# Patient Record
Sex: Female | Born: 1985 | Race: Black or African American | Hispanic: No | Marital: Married | Smoking: Never smoker
Health system: Southern US, Community
[De-identification: ages and names within clinical notes are randomized; demographics above are authoritative.]

## PROBLEM LIST (undated history)

## (undated) ENCOUNTER — Inpatient Hospital Stay (HOSPITAL_COMMUNITY): Payer: Self-pay

## (undated) DIAGNOSIS — O24419 Gestational diabetes mellitus in pregnancy, unspecified control: Secondary | ICD-10-CM

## (undated) DIAGNOSIS — D563 Thalassemia minor: Secondary | ICD-10-CM

## (undated) HISTORY — DX: Thalassemia minor: D56.3

## (undated) HISTORY — DX: Gestational diabetes mellitus in pregnancy, unspecified control: O24.419

---

## 2016-12-24 ENCOUNTER — Encounter (HOSPITAL_COMMUNITY): Payer: Self-pay | Admitting: Family Medicine

## 2016-12-24 ENCOUNTER — Ambulatory Visit (HOSPITAL_COMMUNITY)
Admission: EM | Admit: 2016-12-24 | Discharge: 2016-12-24 | Disposition: A | Discharge status: Home / Self Care | Payer: BLUE CROSS/BLUE SHIELD | Attending: Emergency Medicine | Admitting: Emergency Medicine

## 2016-12-24 DIAGNOSIS — Z3202 Encounter for pregnancy test, result negative: Secondary | ICD-10-CM | POA: Diagnosis not present

## 2016-12-24 DIAGNOSIS — A084 Viral intestinal infection, unspecified: Secondary | ICD-10-CM | POA: Diagnosis not present

## 2016-12-24 DIAGNOSIS — R112 Nausea with vomiting, unspecified: Secondary | ICD-10-CM

## 2016-12-24 DIAGNOSIS — R109 Unspecified abdominal pain: Secondary | ICD-10-CM

## 2016-12-24 LAB — POCT URINALYSIS DIP (DEVICE)
BILIRUBIN URINE: NEGATIVE
Glucose, UA: NEGATIVE mg/dL
HGB URINE DIPSTICK: NEGATIVE
KETONES UR: NEGATIVE mg/dL
LEUKOCYTES UA: NEGATIVE
Nitrite: NEGATIVE
Protein, ur: NEGATIVE mg/dL
Urobilinogen, UA: 0.2 mg/dL (ref 0.0–1.0)
pH: 5.5 (ref 5.0–8.0)

## 2016-12-24 LAB — POCT PREGNANCY, URINE: Preg Test, Ur: NEGATIVE

## 2016-12-24 MED ORDER — LOPERAMIDE HCL 2 MG PO CAPS: 2.0000 mg | ORAL_CAPSULE | Freq: Four times a day (QID) | ORAL | 0 refills | Status: DC | PRN

## 2016-12-24 MED ORDER — ONDANSETRON 4 MG PO TBDP: 4.0000 mg | ORAL_TABLET | Freq: Three times a day (TID) | ORAL | 0 refills | Status: DC | PRN

## 2016-12-24 MED ORDER — OMEPRAZOLE 40 MG PO CPDR: 40.0000 mg | DELAYED_RELEASE_CAPSULE | Freq: Two times a day (BID) | ORAL | 0 refills | Status: DC

## 2016-12-24 MED ORDER — DICYCLOMINE HCL 20 MG PO TABS: 20.0000 mg | ORAL_TABLET | Freq: Two times a day (BID) | ORAL | 0 refills | Status: DC

## 2016-12-24 NOTE — Discharge Instructions (Signed)
You most likely have viral gastritis. I prescribed medications to help your symptoms. Forr Nausea, I have prescribed Zofran, take 1 tablet under the tongue every 8 hours as needed. For abdominal pain and cramping I prescribed a medicine called Bentyl, take one tablet by mouth twice daily. And finally have prescribed a medicine called omeprazole, take 1 tablet by mouth twice daily. Should your symptoms persist, or fail to improve, follow up with your primary care provider or return to clinic as needed. If your symptoms worsen, particularly if you develop worsened abdominal pain, fever, signs or symptoms of severe dehydration, then go to the emergency room.   I have attached the contact information for community health and wellness, contact them to schedule an appointment to establish for primary care.

## 2016-12-24 NOTE — ED Triage Notes (Signed)
Pt here for al over body aches more in the back and chest. sts also diarrhea and vomiting that all started last night. denies cough or fever. Denies recent travel or around anybody that is sick.

## 2016-12-24 NOTE — ED Provider Notes (Signed)
CSN: [id number]     Arrival date & time 12/24/16  1358 History   None    Chief Complaint  Patient presents with  . Generalized Body Aches  . Vomiting   (Consider location/radiation/quality/duration/timing/severity/associated sxs/prior Treatment) Ariana [name] is a 31 y.o. female who [facility] with a chief complaint of N/V/D/abd pain for 24 hours    The history is provided by the patient and the spouse.  Abdominal Pain  Pain location:  Generalized Pain quality: aching, bloating and cramping   Pain radiates to:  Back Pain severity:  Moderate Onset quality:  Gradual Duration:  24 hours Timing:  Constant Progression:  Unchanged Chronicity:  New Context: not diet changes, not eating, not recent illness, not sick contacts, not suspicious food intake and not trauma   Relieved by:  None tried Worsened by:  Movement Ineffective treatments:  None tried Associated symptoms: diarrhea, nausea and vomiting   Associated symptoms: no anorexia, no chills, no dysuria, no fever and no sore throat   Risk factors: not pregnant     History reviewed. No pertinent past medical history. History reviewed. No pertinent surgical history. History reviewed. No pertinent family history. Social History  Substance Use Topics  . Smoking status: Not on file  . Smokeless tobacco: Not on file  . Alcohol use Not on file   OB History    No data available     Review of Systems  Constitutional: Negative for chills and fever.  HENT: Negative for congestion, sinus pain, sinus pressure and sore throat.   Respiratory: Negative.   Cardiovascular: Negative.   Gastrointestinal: Positive for abdominal pain, diarrhea, nausea and vomiting. Negative for anorexia.  Genitourinary: Negative for dysuria.  Musculoskeletal: Negative.   Skin: Negative.   Neurological: Negative.     Allergies  Patient has no known allergies.  Home Medications   Prior to Admission medications    Medication Sig Start Date End Date Taking? Authorizing Provider  dicyclomine (BENTYL) 20 MG tablet Take 1 tablet (20 mg total) by mouth 2 (two) times daily. 12/24/16   Dorena BodoKennard, Mikka Kissner, NP  loperamide (IMODIUM) 2 MG capsule Take 1 capsule (2 mg total) by mouth 4 (four) times daily as needed for diarrhea or loose stools. 12/24/16   Dorena BodoKennard, Huckleberry Martinson, NP  omeprazole (PRILOSEC) 40 MG capsule Take 1 capsule (40 mg total) by mouth 2 (two) times daily. 12/24/16 01/07/17  Dorena BodoKennard, Jaylene Schrom, NP  ondansetron (ZOFRAN ODT) 4 MG disintegrating tablet Take 1 tablet (4 mg total) by mouth every 8 (eight) hours as needed for nausea or vomiting. 12/24/16   Dorena BodoKennard, Yosgar Demirjian, NP   Meds Ordered and Administered this Visit  Medications - No data to display  BP 104/75   Pulse 91   Temp 98.4 F (36.9 C)   Resp 18   LMP 12/14/2016   SpO2 100%  No data found.   Physical Exam  Constitutional: She is oriented to person, place, and time. She appears well-developed and well-nourished. No distress.  HENT:  Head: Normocephalic.  Right Ear: External ear normal.  Left Ear: External ear normal.  Eyes: Conjunctivae are normal.  Cardiovascular: Normal rate and regular rhythm.   Pulmonary/Chest: Effort normal and breath sounds normal.  Abdominal: Soft. Bowel sounds are increased. There is generalized tenderness. There is no rebound, no tenderness at McBurney's point and negative Murphy's sign.  Musculoskeletal:       Lumbar back: She exhibits tenderness.  Neurological: She is alert and oriented to  person, place, and time.  Skin: Skin is warm and dry. Capillary refill takes less than 2 seconds. She is not diaphoretic.  Nursing note and vitals reviewed.   Urgent Care Course     Procedures (including critical care time)  Labs Review Labs Reviewed  POCT URINALYSIS DIP (DEVICE)  POCT PREGNANCY, URINE    Imaging Review No results found.     MDM   1. Viral gastroenteritis    UA and pregnancy test  negative, most likely viral gastroenteritis, given prescriptions for multiple medications, counseling regarding the diagnosis, recommend clear liquid diet for next 24 hours, follow-up as needed, go to the ER anytime if worse. Given contact information for community health and wellness, contact them to establish for primary care.     Dorena Bodo, NP 12/24/16 1456

## 2017-03-01 ENCOUNTER — Encounter (HOSPITAL_COMMUNITY): Payer: Self-pay | Admitting: *Deleted

## 2017-03-01 ENCOUNTER — Ambulatory Visit (HOSPITAL_COMMUNITY)
Admission: EM | Admit: 2017-03-01 | Discharge: 2017-03-01 | Disposition: A | Discharge status: Home / Self Care | Payer: BLUE CROSS/BLUE SHIELD | Attending: Emergency Medicine | Admitting: Emergency Medicine

## 2017-03-01 DIAGNOSIS — R112 Nausea with vomiting, unspecified: Secondary | ICD-10-CM

## 2017-03-01 DIAGNOSIS — E860 Dehydration: Secondary | ICD-10-CM

## 2017-03-01 DIAGNOSIS — M545 Low back pain: Secondary | ICD-10-CM

## 2017-03-01 DIAGNOSIS — O0000 Abdominal pregnancy without intrauterine pregnancy: Secondary | ICD-10-CM | POA: Diagnosis not present

## 2017-03-01 DIAGNOSIS — R1084 Generalized abdominal pain: Secondary | ICD-10-CM

## 2017-03-01 DIAGNOSIS — Z3201 Encounter for pregnancy test, result positive: Secondary | ICD-10-CM

## 2017-03-01 LAB — POCT URINALYSIS DIP (DEVICE)
GLUCOSE, UA: NEGATIVE mg/dL
HGB URINE DIPSTICK: NEGATIVE
KETONES UR: 40 mg/dL — AB
LEUKOCYTES UA: NEGATIVE
Nitrite: NEGATIVE
Protein, ur: 30 mg/dL — AB
Urobilinogen, UA: 1 mg/dL (ref 0.0–1.0)
pH: 6 (ref 5.0–8.0)

## 2017-03-01 LAB — POCT PREGNANCY, URINE: Preg Test, Ur: POSITIVE — AB

## 2017-03-01 MED ORDER — ONDANSETRON HCL 4 MG PO TABS: 4.0000 mg | ORAL_TABLET | Freq: Four times a day (QID) | ORAL | 0 refills | Status: DC

## 2017-03-01 NOTE — ED Provider Notes (Addendum)
[facility]    CSN: [id number] Arrival date & time: 03/01/17  1628     History   Chief Complaint Chief Complaint  Patient presents with  . Emesis    HPI Ariana [name] is a 31 y.o. female.    Pt c/o generalized abd pain n/v for over 1 week. States that she does have some lower back pain. Would also like to know if she is pregnant. States that her lMP was last month and normal.  Has not taken anything pta.        History reviewed. No pertinent past medical history.  There are no active problems to display for this patient.   History reviewed. No pertinent surgical history.  OB History    No data available       Home Medications    Prior to Admission medications   Medication Sig Start Date End Date Taking? Authorizing Provider  dicyclomine (BENTYL) 20 MG tablet Take 1 tablet (20 mg total) by mouth 2 (two) times daily. 12/24/16   Dorena Bodo, NP  loperamide (IMODIUM) 2 MG capsule Take 1 capsule (2 mg total) by mouth 4 (four) times daily as needed for diarrhea or loose stools. 12/24/16   Dorena Bodo, NP  omeprazole (PRILOSEC) 40 MG capsule Take 1 capsule (40 mg total) by mouth 2 (two) times daily. 12/24/16 01/07/17  Dorena Bodo, NP  ondansetron (ZOFRAN ODT) 4 MG disintegrating tablet Take 1 tablet (4 mg total) by mouth every 8 (eight) hours as needed for nausea or vomiting. 12/24/16   Dorena Bodo, NP  ondansetron (ZOFRAN) 4 MG tablet Take 1 tablet (4 mg total) by mouth every 6 (six) hours. 03/01/17   Coralyn Mark, NP    Family History History reviewed. No pertinent family history.  Social History Social History  Substance Use Topics  . Smoking status: Never Smoker  . Smokeless tobacco: Never Used  . Alcohol use Not on file     Allergies   Patient has no known allergies.   Review of Systems Review of Systems  Constitutional: Positive for chills, fatigue and fever.  Respiratory: Negative.   Cardiovascular: Negative.     Gastrointestinal: Positive for abdominal pain, nausea and vomiting.  Endocrine: Negative.   Genitourinary: Positive for pelvic pain and urgency.  Neurological: Negative.      Physical Exam Triage Vital Signs ED Triage Vitals [03/01/17 1727]  Enc Vitals Group     BP 120/82     Pulse Rate 85     Resp 16     Temp 98.7 F (37.1 C)     Temp Source Oral     SpO2 100 %     Weight      Height      Head Circumference      Peak Flow      Pain Score 4     Pain Loc      Pain Edu?      Excl. in GC?    No data found.   Updated Vital Signs BP 120/82 (BP Location: Left Arm)   Pulse 85   Temp 98.7 F (37.1 C) (Oral)   Resp 16   LMP 01/17/2017   SpO2 100%   Visual Acuity Right Eye Distance:   Left Eye Distance:   Bilateral Distance:    Right Eye Near:   Left Eye Near:    Bilateral Near:     Physical Exam  Constitutional: She appears well-developed.  Cardiovascular: Normal rate and regular  rhythm.   Pulmonary/Chest: Effort normal and breath sounds normal.  Abdominal: Soft. Bowel sounds are normal.  cv tenderness   Neurological: She is alert.  Skin: Skin is warm. Capillary refill takes less than 2 seconds.     UC Treatments / Results  Labs (all labs ordered are listed, but only abnormal results are displayed) Labs Reviewed  POCT URINALYSIS DIP (DEVICE) - Abnormal; Notable for the following:       Result Value   Bilirubin Urine SMALL (*)    Ketones, ur 40 (*)    Protein, ur 30 (*)    All other components within normal limits  POCT PREGNANCY, URINE - Abnormal; Notable for the following:    Preg Test, Ur POSITIVE (*)    All other components within normal limits    EKG  EKG Interpretation None       Radiology No results found.  Procedures Procedures (including critical care time)  Medications Ordered in UC Medications - No data to display   Initial Impression / Assessment and Plan / UC Course  I have reviewed the triage vital signs and the nursing  notes.  Pertinent labs & imaging results that were available during my care of the patient were reviewed by me and considered in my medical decision making (see chart for details).   Will need to see obgyn for positive preg  Take nausea medications as needed         Final Clinical Impressions(s) / UC Diagnoses   Final diagnoses:  Nausea and vomiting, intractability of vomiting not specified, unspecified vomiting type  Dehydration  Generalized abdominal pain  Abdominal pregnancy, unspecified whether intrauterine pregnancy present    New Prescriptions Discharge Medication List as of 03/01/2017  6:09 PM    START taking these medications   Details  ondansetron (ZOFRAN) 4 MG tablet Take 1 tablet (4 mg total) by mouth every 6 (six) hours., Starting Mon 03/01/2017, Normal         Controlled Substance Prescriptions [place] Controlled Substance Registry consulted? Not Applicable  Coralyn Mark, NP 03/01/17 1810  Coralyn Mark, NP 03/01/17 1821

## 2017-03-01 NOTE — ED Triage Notes (Addendum)
Patient reports vomiting since end of last week with generalized fatigue. Patient reports chest burning, is worse after eating.   Would also like to know if she is pregnant.

## 2017-03-01 NOTE — Discharge Instructions (Signed)
You will need to see an obgyn one you have seen in the past.  Drink plenty of fluids Avoid caffeine Take nausea medication as needed You have a positive preg test

## 2017-03-12 ENCOUNTER — Inpatient Hospital Stay (HOSPITAL_COMMUNITY)
Admission: AD | Admit: 2017-03-12 | Discharge: 2017-03-12 | Disposition: A | Discharge status: Home / Self Care | Payer: BLUE CROSS/BLUE SHIELD | Source: Ambulatory Visit | Attending: Obstetrics & Gynecology | Admitting: Obstetrics & Gynecology

## 2017-03-12 ENCOUNTER — Encounter (HOSPITAL_COMMUNITY): Payer: Self-pay | Admitting: *Deleted

## 2017-03-12 DIAGNOSIS — O219 Vomiting of pregnancy, unspecified: Secondary | ICD-10-CM

## 2017-03-12 DIAGNOSIS — K117 Disturbances of salivary secretion: Secondary | ICD-10-CM | POA: Diagnosis not present

## 2017-03-12 DIAGNOSIS — O218 Other vomiting complicating pregnancy: Secondary | ICD-10-CM | POA: Diagnosis present

## 2017-03-12 DIAGNOSIS — Z3A08 8 weeks gestation of pregnancy: Secondary | ICD-10-CM | POA: Insufficient documentation

## 2017-03-12 DIAGNOSIS — O99611 Diseases of the digestive system complicating pregnancy, first trimester: Secondary | ICD-10-CM | POA: Insufficient documentation

## 2017-03-12 DIAGNOSIS — R112 Nausea with vomiting, unspecified: Secondary | ICD-10-CM | POA: Insufficient documentation

## 2017-03-12 LAB — URINALYSIS, ROUTINE W REFLEX MICROSCOPIC
Bacteria, UA: NONE SEEN
Glucose, UA: NEGATIVE mg/dL
HGB URINE DIPSTICK: NEGATIVE
KETONES UR: 20 mg/dL — AB
LEUKOCYTES UA: NEGATIVE
Nitrite: NEGATIVE
PH: 5 (ref 5.0–8.0)
Protein, ur: 100 mg/dL — AB
SPECIFIC GRAVITY, URINE: 1.031 — AB (ref 1.005–1.030)

## 2017-03-12 LAB — BASIC METABOLIC PANEL
Anion gap: 17 — ABNORMAL HIGH (ref 5–15)
BUN: 16 mg/dL (ref 6–20)
CHLORIDE: 92 mmol/L — AB (ref 101–111)
CO2: 22 mmol/L (ref 22–32)
CREATININE: 1 mg/dL (ref 0.44–1.00)
Calcium: 9.6 mg/dL (ref 8.9–10.3)
GFR calc Af Amer: >60 mL/min (ref >60)
GFR calc non Af Amer: >60 mL/min (ref >60)
GLUCOSE: 93 mg/dL (ref 65–99)
POTASSIUM: 4.6 mmol/L (ref 3.5–5.1)
Sodium: 131 mmol/L — ABNORMAL LOW (ref 135–145)

## 2017-03-12 LAB — CBC
HEMATOCRIT: 39.7 % (ref 36.0–46.0)
HEMOGLOBIN: 13.8 g/dL (ref 12.0–15.0)
MCH: 29.2 pg (ref 26.0–34.0)
MCHC: 34.8 g/dL (ref 30.0–36.0)
MCV: 83.9 fL (ref 78.0–100.0)
Platelets: 244 10*3/uL (ref 150–400)
RBC: 4.73 MIL/uL (ref 3.87–5.11)
RDW: 12.2 % (ref 11.5–15.5)
WBC: 7.6 10*3/uL (ref 4.0–10.5)

## 2017-03-12 MED ORDER — M.V.I. ADULT IV INJ
Freq: Once | INTRAVENOUS | Status: AC
  Administered 2017-03-12: 12:00:00 via INTRAVENOUS
  Filled 2017-03-12: qty 10

## 2017-03-12 MED ORDER — PROMETHAZINE HCL 25 MG PO TABS: 25.0000 mg | ORAL_TABLET | Freq: Four times a day (QID) | ORAL | 0 refills | Status: DC | PRN

## 2017-03-12 MED ORDER — LACTATED RINGERS IV BOLUS (SEPSIS)
1000.0000 mL | Freq: Once | INTRAVENOUS | Status: AC
  Administered 2017-03-12: 1000 mL via INTRAVENOUS

## 2017-03-12 MED ORDER — GLYCOPYRROLATE 1 MG PO TABS: 1.0000 mg | ORAL_TABLET | Freq: Three times a day (TID) | ORAL | 0 refills | Status: DC

## 2017-03-12 MED ORDER — GLYCOPYRROLATE 0.2 MG/ML IJ SOLN
0.2000 mg | Freq: Once | INTRAMUSCULAR | Status: AC
  Administered 2017-03-12: 0.2 mg via INTRAVENOUS
  Filled 2017-03-12: qty 1

## 2017-03-12 MED ORDER — PROMETHAZINE HCL 25 MG/ML IJ SOLN
25.0000 mg | Freq: Once | INTRAMUSCULAR | Status: AC
  Administered 2017-03-12: 25 mg via INTRAVENOUS
  Filled 2017-03-12: qty 1

## 2017-03-12 MED ORDER — LACTATED RINGERS IV BOLUS (SEPSIS): 1000.0000 mL | Freq: Once | INTRAVENOUS | Status: DC

## 2017-03-12 MED ORDER — PANTOPRAZOLE SODIUM 40 MG IV SOLR
40.0000 mg | Freq: Once | INTRAVENOUS | Status: AC
  Administered 2017-03-12: 40 mg via INTRAVENOUS
  Filled 2017-03-12: qty 40

## 2017-03-12 NOTE — MAU Note (Signed)
Pt presents with c/o being "weak".  States she's been vomiting for approximately 2 weeks.  Also c/o sore throat and neck pain.

## 2017-03-12 NOTE — Discharge Instructions (Signed)
Morning Sickness Morning sickness is when you feel sick to your stomach (nauseous) during pregnancy. This nauseous feeling may or may not come with vomiting. It often occurs in the morning but can be a problem any time of day. Morning sickness is most common during the first trimester, but it may continue throughout pregnancy. While morning sickness is unpleasant, it is usually harmless unless you develop severe and continual vomiting (hyperemesis gravidarum). This condition requires more intense treatment. What are the causes? The cause of morning sickness is not completely known but seems to be related to normal hormonal changes that occur in pregnancy. What increases the risk? You are at greater risk if you:  Experienced nausea or vomiting before your pregnancy.  Had morning sickness during a previous pregnancy.  Are pregnant with more than one baby, such as twins.  How is this treated? Do not use any medicines (prescription, over-the-counter, or herbal) for morning sickness without first talking to your health care provider. Your health care provider may prescribe or recommend:  Vitamin B6 supplements.  Anti-nausea medicines.  The herbal medicine ginger.  Follow these instructions at home:  Only take over-the-counter or prescription medicines as directed by your health care provider.  Taking multivitamins before getting pregnant can prevent or decrease the severity of morning sickness in most women.  Eat a piece of dry toast or unsalted crackers before getting out of bed in the morning.  Eat five or six small meals a day.  Eat dry and bland foods (rice, baked potato). Foods high in carbohydrates are often helpful.  Do not drink liquids with your meals. Drink liquids between meals.  Avoid greasy, fatty, and spicy foods.  Get someone to cook for you if the smell of any food causes nausea and vomiting.  If you feel nauseous after taking prenatal vitamins, take the vitamins at  night or with a snack.  Snack on protein foods (nuts, yogurt, cheese) between meals if you are hungry.  Eat unsweetened gelatins for desserts.  Wearing an acupressure wristband (worn for sea sickness) may be helpful.  Acupuncture may be helpful.  Do not smoke.  Get a humidifier to keep the air in your house free of odors.  Get plenty of fresh air. Contact a health care provider if:  Your home remedies are not working, and you need medicine.  You feel dizzy or lightheaded.  You are losing weight. Get help right away if:  You have persistent and uncontrolled nausea and vomiting.  You pass out (faint). This information is not intended to replace advice given to you by your health care provider. Make sure you discuss any questions you have with your health care provider. Document Released: 07/23/2006 Document Revised: 11/07/2015 Document Reviewed: 11/16/2012 Elsevier Interactive Patient Education  2017 [employer]  Constipation, Adult Constipation is when a person:  Poops (has a bowel movement) fewer times in a week than normal.  Has a hard time pooping.  Has poop that is dry, hard, or bigger than normal.  Follow these instructions at home: Eating and drinking   Eat foods that have a lot of fiber, such as: ? Fresh fruits and vegetables. ? Whole grains. ? Beans.  Eat less of foods that are high in fat, low in fiber, or overly processed, such as: ? [place] fries. ? Hamburgers. ? Cookies. ? Candy. ? Soda.  Drink enough fluid to keep your pee (urine) clear or pale yellow. General instructions  Exercise regularly or as told by your doctor.  Go to the restroom when you feel like you need to poop. Do not hold it in.  Take over-the-counter and prescription medicines only as told by your doctor. These include any fiber supplements.  Do pelvic floor retraining exercises, such as: ? Doing deep breathing while relaxing your lower belly (abdomen). ? Relaxing your  pelvic floor while pooping.  Watch your condition for any changes.  Keep all follow-up visits as told by your doctor. This is important. Contact a doctor if:  You have pain that gets worse.  You have a fever.  You have not pooped for 4 days.  You throw up (vomit).  You are not hungry.  You lose weight.  You are bleeding from the anus.  You have thin, pencil-like poop (stool). Get help right away if:  You have a fever, and your symptoms suddenly get worse.  You leak poop or have blood in your poop.  Your belly feels hard or bigger than normal (is bloated).  You have very bad belly pain.  You feel dizzy or you faint. This information is not intended to replace advice given to you by your health care provider. Make sure you discuss any questions you have with your health care provider. Document Released: 11/18/2007 Document Revised: 12/20/2015 Document Reviewed: 11/20/2015 Elsevier Interactive Patient Education  2017 [employer].

## 2017-03-12 NOTE — MAU Provider Note (Signed)
History     CSN: [id number]  Arrival date and time: 03/12/17 0941  First Provider Initiated Contact with Patient 03/12/17 1023      Chief Complaint  Patient presents with  . Emesis  . Fatigue  . Neck Pain   HPI Ariana [name] is a 31 y.o. G4P3003 at [redacted]w[redacted]d who presents for vomiting & weakness. Reports vomiting for the last 2 weeks. Was previously prescribed zofran by urgent care but has finished her prescription. Reports vomiting 6+ times today and is also having increased spitting. Endorses throat pain & neck pain. Denies abdominal pain, fever/chills, or vaginal bleeding. Last BM was 2 weeks ago. States she normally has a BM once per week.   OB History    Gravida Para Term Preterm AB Living   SAB TAB Ectopic Multiple Live Births           3      History reviewed. No pertinent past medical history.  History reviewed. No pertinent surgical history.  No family history on file.  Social History  Substance Use Topics  . Smoking status: Never Smoker  . Smokeless tobacco: Never Used  . Alcohol use Not on file    Allergies: No Known Allergies  Prescriptions Prior to Admission  Medication Sig Dispense Refill Last Dose  . dicyclomine (BENTYL) 20 MG tablet Take 1 tablet (20 mg total) by mouth 2 (two) times daily. 20 tablet 0   . loperamide (IMODIUM) 2 MG capsule Take 1 capsule (2 mg total) by mouth 4 (four) times daily as needed for diarrhea or loose stools. 12 capsule 0   . omeprazole (PRILOSEC) 40 MG capsule Take 1 capsule (40 mg total) by mouth 2 (two) times daily. 28 capsule 0   . ondansetron (ZOFRAN ODT) 4 MG disintegrating tablet Take 1 tablet (4 mg total) by mouth every 8 (eight) hours as needed for nausea or vomiting. 20 tablet 0   . ondansetron (ZOFRAN) 4 MG tablet Take 1 tablet (4 mg total) by mouth every 6 (six) hours. 12 tablet 0     Review of Systems  HENT: Positive for sore throat.   Gastrointestinal: Positive for constipation, nausea and vomiting.  Negative for abdominal pain and diarrhea.  Genitourinary: Negative.   Neurological: Positive for weakness. Negative for dizziness and headaches.   Physical Exam   Blood pressure 116/80, pulse 99, temperature 98.2 F (36.8 C), temperature source Oral, resp. rate 18, weight 176 lb 8 oz (80.1 kg), last menstrual period 01/13/2017, SpO2 100 %.  Physical Exam  Nursing note and vitals reviewed. Constitutional: She is oriented to person, place, and time. She appears well-developed and well-nourished. No distress.  HENT:  Head: Normocephalic and atraumatic.  Mouth/Throat: Oropharynx is clear and moist.  Eyes: Conjunctivae are normal. Right eye exhibits no discharge. Left eye exhibits no discharge. No scleral icterus.  Neck: Normal range of motion.  Cardiovascular: Normal rate, regular rhythm and normal heart sounds.   No murmur heard. Respiratory: Effort normal and breath sounds normal. No respiratory distress. She has no wheezes.  GI: Soft. Bowel sounds are normal. She exhibits no distension. There is no tenderness.  Neurological: She is alert and oriented to person, place, and time.  Skin: Skin is warm and dry. She is not diaphoretic.  Psychiatric: She has a normal mood and affect. Her behavior is normal. Judgment and thought content normal.    MAU Course  Procedures Results for orders placed or  performed during the hospital encounter of 03/12/17 (from the past 24 hour(s))  Urinalysis, Routine w reflex microscopic     Status: Abnormal   Collection Time: 03/12/17  9:58 AM  Result Value Ref Range   Color, Urine AMBER (A) YELLOW   APPearance HAZY (A) CLEAR   Specific Gravity, Urine 1.031 (H) 1.005 - 1.030   pH 5.0 5.0 - 8.0   Glucose, UA NEGATIVE NEGATIVE mg/dL   Hgb urine dipstick NEGATIVE NEGATIVE   Bilirubin Urine SMALL (A) NEGATIVE   Ketones, ur 20 (A) NEGATIVE mg/dL   Protein, ur 161 (A) NEGATIVE mg/dL   Nitrite NEGATIVE NEGATIVE   Leukocytes, UA NEGATIVE NEGATIVE   RBC / HPF  0-5 0 - 5 RBC/hpf   WBC, UA 6-30 0 - 5 WBC/hpf   Bacteria, UA NONE SEEN NONE SEEN   Squamous Epithelial / LPF 0-5 (A) NONE SEEN   Mucus PRESENT    Hyaline Casts, UA PRESENT   CBC     Status: None   Collection Time: 03/12/17 10:50 AM  Result Value Ref Range   WBC 7.6 4.0 - 10.5 K/uL   RBC 4.73 3.87 - 5.11 MIL/uL   Hemoglobin 13.8 12.0 - 15.0 g/dL   HCT 09.6 04.5 - 40.9 %   MCV 83.9 78.0 - 100.0 fL   MCH 29.2 26.0 - 34.0 pg   MCHC 34.8 30.0 - 36.0 g/dL   RDW 81.1 91.4 - 78.2 %   Platelets 244 150 - 400 K/uL  Basic metabolic panel     Status: Abnormal   Collection Time: 03/12/17 10:50 AM  Result Value Ref Range   Sodium 131 (L) 135 - 145 mmol/L   Potassium 4.6 3.5 - 5.1 mmol/L   Chloride 92 (L) 101 - 111 mmol/L   CO2 22 22 - 32 mmol/L   Glucose, Bld 93 65 - 99 mg/dL   BUN 16 6 - 20 mg/dL   Creatinine, Ser 9.56 0.44 - 1.00 mg/dL   Calcium 9.6 8.9 - 21.3 mg/dL   GFR calc non Af Amer >60 >60 mL/min   GFR calc Af Amer >60 >60 mL/min   Anion gap 17 (H) 5 - 15    MDM IV LR bolus followed by MVI in D5LR Phenergan 25 mg, protonix 40 mg, & robinul 0.2 mg IV CBC & BMP Patient reports improvement in symptoms & not observed vomiting in MAU Assessment and Plan  A: 1. [redacted] weeks gestation of pregnancy   2. Nausea and vomiting during pregnancy prior to [redacted] weeks gestation   3. Ptyalism    P: Discharge home Rx phenergan & robinul Start prenatal care Discussed reasons to return to MAU  Judeth Horn 03/12/2017, 10:01 AM

## 2017-03-12 NOTE — MAU Note (Signed)
Feels weak. Can't eat, keeps vomiting (spitting noted). Pain in her neck and throat.  Was seen at [facility] on 9/17, +preg test, states was given a prescription - but has finished it.

## 2017-03-31 ENCOUNTER — Other Ambulatory Visit: Payer: Self-pay | Admitting: Student

## 2017-04-06 ENCOUNTER — Emergency Department (HOSPITAL_COMMUNITY)
Admission: EM | Admit: 2017-04-06 | Discharge: 2017-04-06 | Disposition: A | Discharge status: Home / Self Care | Payer: BLUE CROSS/BLUE SHIELD | Attending: Emergency Medicine | Admitting: Emergency Medicine

## 2017-04-06 ENCOUNTER — Encounter (HOSPITAL_COMMUNITY): Payer: Self-pay | Admitting: Emergency Medicine

## 2017-04-06 ENCOUNTER — Emergency Department (HOSPITAL_COMMUNITY): Payer: BLUE CROSS/BLUE SHIELD

## 2017-04-06 DIAGNOSIS — Z3A12 12 weeks gestation of pregnancy: Secondary | ICD-10-CM | POA: Insufficient documentation

## 2017-04-06 DIAGNOSIS — Z79899 Other long term (current) drug therapy: Secondary | ICD-10-CM | POA: Diagnosis not present

## 2017-04-06 DIAGNOSIS — R0789 Other chest pain: Secondary | ICD-10-CM | POA: Insufficient documentation

## 2017-04-06 DIAGNOSIS — O210 Mild hyperemesis gravidarum: Secondary | ICD-10-CM

## 2017-04-06 DIAGNOSIS — R1110 Vomiting, unspecified: Secondary | ICD-10-CM | POA: Diagnosis present

## 2017-04-06 LAB — COMPREHENSIVE METABOLIC PANEL
ALT: 39 U/L (ref 14–54)
ANION GAP: 8 (ref 5–15)
AST: 30 U/L (ref 15–41)
Albumin: 4.1 g/dL (ref 3.5–5.0)
Alkaline Phosphatase: 49 U/L (ref 38–126)
BILIRUBIN TOTAL: 1.3 mg/dL — AB (ref 0.3–1.2)
BUN: 6 mg/dL (ref 6–20)
CO2: 25 mmol/L (ref 22–32)
Calcium: 9.3 mg/dL (ref 8.9–10.3)
Chloride: 102 mmol/L (ref 101–111)
Creatinine, Ser: 0.64 mg/dL (ref 0.44–1.00)
Glucose, Bld: 94 mg/dL (ref 65–99)
POTASSIUM: 4.2 mmol/L (ref 3.5–5.1)
Sodium: 135 mmol/L (ref 135–145)
TOTAL PROTEIN: 7.2 g/dL (ref 6.5–8.1)

## 2017-04-06 LAB — URINALYSIS, ROUTINE W REFLEX MICROSCOPIC
BILIRUBIN URINE: NEGATIVE
GLUCOSE, UA: NEGATIVE mg/dL
HGB URINE DIPSTICK: NEGATIVE
KETONES UR: NEGATIVE mg/dL
Leukocytes, UA: NEGATIVE
NITRITE: NEGATIVE
PH: 7 (ref 5.0–8.0)
Protein, ur: NEGATIVE mg/dL
SPECIFIC GRAVITY, URINE: 1.006 (ref 1.005–1.030)

## 2017-04-06 LAB — CBC
HEMATOCRIT: 33.4 % — AB (ref 36.0–46.0)
HEMOGLOBIN: 11.1 g/dL — AB (ref 12.0–15.0)
MCH: 29.2 pg (ref 26.0–34.0)
MCHC: 33.2 g/dL (ref 30.0–36.0)
MCV: 87.9 fL (ref 78.0–100.0)
Platelets: 204 10*3/uL (ref 150–400)
RBC: 3.8 MIL/uL — ABNORMAL LOW (ref 3.87–5.11)
RDW: 13.2 % (ref 11.5–15.5)
WBC: 5.9 10*3/uL (ref 4.0–10.5)

## 2017-04-06 LAB — I-STAT CG4 LACTIC ACID, ED: Lactic Acid, Venous: 1.19 mmol/L (ref 0.5–1.9)

## 2017-04-06 MED ORDER — ONDANSETRON HCL 4 MG/2ML IJ SOLN
4.0000 mg | Freq: Once | INTRAMUSCULAR | Status: AC
  Administered 2017-04-06: 4 mg via INTRAVENOUS
  Filled 2017-04-06: qty 2

## 2017-04-06 MED ORDER — PRENATAL COMPLETE 14-0.4 MG PO TABS: 1.0000 | ORAL_TABLET | Freq: Every day | ORAL | 0 refills | Status: DC

## 2017-04-06 MED ORDER — LACTATED RINGERS IV BOLUS (SEPSIS)
1000.0000 mL | Freq: Once | INTRAVENOUS | Status: AC
  Administered 2017-04-06: 1000 mL via INTRAVENOUS

## 2017-04-06 MED ORDER — PANTOPRAZOLE SODIUM 40 MG IV SOLR
40.0000 mg | INTRAVENOUS | Status: AC
  Administered 2017-04-06: 40 mg via INTRAVENOUS
  Filled 2017-04-06: qty 40

## 2017-04-06 MED ORDER — DOCUSATE SODIUM 100 MG PO CAPS: 100.0000 mg | ORAL_CAPSULE | Freq: Every day | ORAL | 0 refills | Status: DC

## 2017-04-06 MED ORDER — ONDANSETRON 4 MG PO TBDP: 4.0000 mg | ORAL_TABLET | Freq: Three times a day (TID) | ORAL | 0 refills | Status: DC | PRN

## 2017-04-06 MED ORDER — FAMOTIDINE 20 MG PO TABS: 20.0000 mg | ORAL_TABLET | Freq: Two times a day (BID) | ORAL | 0 refills | Status: DC
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
## 2017-04-07 ENCOUNTER — Encounter: Payer: Self-pay | Admitting: Obstetrics & Gynecology

## 2017-04-07 ENCOUNTER — Ambulatory Visit (INDEPENDENT_AMBULATORY_CARE_PROVIDER_SITE_OTHER): Payer: BLUE CROSS/BLUE SHIELD | Admitting: Obstetrics & Gynecology

## 2017-04-07 VITALS — BP 107/75 | HR 102 | Wt 183.0 lb

## 2017-04-07 DIAGNOSIS — Z3480 Encounter for supervision of other normal pregnancy, unspecified trimester: Secondary | ICD-10-CM

## 2017-04-07 DIAGNOSIS — Z3202 Encounter for pregnancy test, result negative: Secondary | ICD-10-CM

## 2017-04-07 DIAGNOSIS — Z124 Encounter for screening for malignant neoplasm of cervix: Secondary | ICD-10-CM | POA: Diagnosis not present

## 2017-04-07 DIAGNOSIS — Z23 Encounter for immunization: Secondary | ICD-10-CM | POA: Diagnosis not present

## 2017-04-07 DIAGNOSIS — Z113 Encounter for screening for infections with a predominantly sexual mode of transmission: Secondary | ICD-10-CM

## 2017-04-07 DIAGNOSIS — O0993 Supervision of high risk pregnancy, unspecified, third trimester: Secondary | ICD-10-CM | POA: Insufficient documentation

## 2017-04-07 DIAGNOSIS — Z1151 Encounter for screening for human papillomavirus (HPV): Secondary | ICD-10-CM

## 2017-04-07 DIAGNOSIS — Z3483 Encounter for supervision of other normal pregnancy, third trimester: Secondary | ICD-10-CM

## 2017-04-07 LAB — POCT URINE PREGNANCY: PREG TEST UR: POSITIVE — AB

## 2017-04-07 NOTE — Patient Instructions (Addendum)
First Trimester of Pregnancy The first trimester of pregnancy is from week 1 until the end of week 13 (months 1 through 3). During this time, your baby will begin to develop inside you. At 6-8 weeks, the eyes and face are formed, and the heartbeat can be seen on ultrasound. At the end of 12 weeks, all the baby's organs are formed. Prenatal care is all the medical care you receive before the birth of your baby. Make sure you get good prenatal care and follow all of your doctor's instructions. Follow these instructions at home: Medicines  Take over-the-counter and prescription medicines only as told by your doctor. Some medicines are safe and some medicines are not safe during pregnancy.  Take a prenatal vitamin that contains at least 600 micrograms (mcg) of folic acid.  If you have trouble pooping (constipation), take medicine that will make your stool soft (stool softener) if your doctor approves. Eating and drinking  Eat regular, healthy meals.  Your doctor will tell you the amount of weight gain that is right for you.  Avoid raw meat and uncooked cheese.  If you feel sick to your stomach (nauseous) or throw up (vomit): ? Eat 4 or 5 small meals a day instead of 3 large meals. ? Try eating a few soda crackers. ? Drink liquids between meals instead of during meals.  To prevent constipation: ? Eat foods that are high in fiber, like fresh fruits and vegetables, whole grains, and beans. ? Drink enough fluids to keep your pee (urine) clear or pale yellow. Activity  Exercise only as told by your doctor. Stop exercising if you have cramps or pain in your lower belly (abdomen) or low back.  Do not exercise if it is too hot, too humid, or if you are in a place of great height (high altitude).  Try to avoid standing for long periods of time. Move your legs often if you must stand in one place for a long time.  Avoid heavy lifting.  Wear low-heeled shoes. Sit and stand up straight.  You  can have sex unless your doctor tells you not to. Relieving pain and discomfort  Wear a good support bra if your breasts are sore.  Take warm water baths (sitz baths) to soothe pain or discomfort caused by hemorrhoids. Use hemorrhoid cream if your doctor says it is okay.  Rest with your legs raised if you have leg cramps or low back pain.  If you have puffy, bulging veins (varicose veins) in your legs: ? Wear support hose or compression stockings as told by your doctor. ? Raise (elevate) your feet for 15 minutes, 3-4 times a day. ? Limit salt in your food. Prenatal care  Schedule your prenatal visits by the twelfth week of pregnancy.  Write down your questions. Take them to your prenatal visits.  Keep all your prenatal visits as told by your doctor. This is important. Safety  Wear your seat belt at all times when driving.  Make a list of emergency phone numbers. The list should include numbers for family, friends, the hospital, and police and fire departments. General instructions  Ask your doctor for a referral to a local prenatal class. Begin classes no later than at the start of month 6 of your pregnancy.  Ask for help if you need counseling or if you need help with nutrition. Your doctor can give you advice or tell you where to go for help.  Do not use hot tubs, steam rooms, or   saunas.  Do not douche or use tampons or scented sanitary pads.  Do not cross your legs for long periods of time.  Avoid all herbs and alcohol. Avoid drugs that are not approved by your doctor.  Do not use any tobacco products, including cigarettes, chewing tobacco, and electronic cigarettes. If you need help quitting, ask your doctor. You may get counseling or other support to help you quit.  Avoid cat litter boxes and soil used by cats. These carry germs that can cause birth defects in the baby and can cause a loss of your baby (miscarriage) or stillbirth.  Visit your dentist. At home, brush  your teeth with a soft toothbrush. Be gentle when you floss. Contact a doctor if:  You are dizzy.  You have mild cramps or pressure in your lower belly.  You have a nagging pain in your belly area.  You continue to feel sick to your stomach, you throw up, or you have watery poop (diarrhea).  You have a bad smelling fluid coming from your vagina.  You have pain when you pee (urinate).  You have increased puffiness (swelling) in your face, hands, legs, or ankles. Get help right away if:  You have a fever.  You are leaking fluid from your vagina.  You have spotting or bleeding from your vagina.  You have very bad belly cramping or pain.  You gain or lose weight rapidly.  You throw up blood. It may look like coffee grounds.  You are around people who have [place] measles, fifth disease, or chickenpox.  You have a very bad headache.  You have shortness of breath.  You have any kind of trauma, such as from a fall or a car accident. Summary  The first trimester of pregnancy is from week 1 until the end of week 13 (months 1 through 3).  To take care of yourself and your unborn baby, you will need to eat healthy meals, take medicines only if your doctor tells you to do so, and do activities that are safe for you and your baby.  Keep all follow-up visits as told by your doctor. This is important as your doctor will have to ensure that your baby is healthy and growing well. This information is not intended to replace advice given to you by your health care provider. Make sure you discuss any questions you have with your health care provider. Document Released: 11/18/2007 Document Revised: 06/09/2016 Document Reviewed: 06/09/2016 Elsevier Interactive Patient Education  2017 [employer].    Second Trimester of Pregnancy The second trimester is from week 13 through week 28, month 4 through 6. This is often the time in pregnancy that you feel your best. Often times, morning  sickness has lessened or quit. You may have more energy, and you may get hungry more often. Your unborn baby (fetus) is growing rapidly. At the end of the sixth month, he or she is about 9 inches long and weighs about 1 pounds. You will likely feel the baby move (quickening) between 18 and 20 weeks of pregnancy. Follow these instructions at home:  Avoid all smoking, herbs, and alcohol. Avoid drugs not approved by your doctor.  Do not use any tobacco products, including cigarettes, chewing tobacco, and electronic cigarettes. If you need help quitting, ask your doctor. You may get counseling or other support to help you quit.  Only take medicine as told by your doctor. Some medicines are safe and some are not during pregnancy.  Exercise  only as told by your doctor. Stop exercising if you start having cramps.  Eat regular, healthy meals.  Wear a good support bra if your breasts are tender.  Do not use hot tubs, steam rooms, or saunas.  Wear your seat belt when driving.  Avoid raw meat, uncooked cheese, and liter boxes and soil used by cats.  Take your prenatal vitamins.  Take 1500-2000 milligrams of calcium daily starting at the 20th week of pregnancy until you deliver your baby.  Try taking medicine that helps you poop (stool softener) as needed, and if your doctor approves. Eat more fiber by eating fresh fruit, vegetables, and whole grains. Drink enough fluids to keep your pee (urine) clear or pale yellow.  Take warm water baths (sitz baths) to soothe pain or discomfort caused by hemorrhoids. Use hemorrhoid cream if your doctor approves.  If you have puffy, bulging veins (varicose veins), wear support hose. Raise (elevate) your feet for 15 minutes, 3-4 times a day. Limit salt in your diet.  Avoid heavy lifting, wear low heals, and sit up straight.  Rest with your legs raised if you have leg cramps or low back pain.  Visit your dentist if you have not gone during your pregnancy. Use  a soft toothbrush to brush your teeth. Be gentle when you floss.  You can have sex (intercourse) unless your doctor tells you not to.  Go to your doctor visits. Get help if:  You feel dizzy.  You have mild cramps or pressure in your lower belly (abdomen).  You have a nagging pain in your belly area.  You continue to feel sick to your stomach (nauseous), throw up (vomit), or have watery poop (diarrhea).  You have bad smelling fluid coming from your vagina.  You have pain with peeing (urination). Get help right away if:  You have a fever.  You are leaking fluid from your vagina.  You have spotting or bleeding from your vagina.  You have severe belly cramping or pain.  You lose or gain weight rapidly.  You have trouble catching your breath and have chest pain.  You notice sudden or extreme puffiness (swelling) of your face, hands, ankles, feet, or legs.  You have not felt the baby move in over an hour.  You have severe headaches that do not go away with medicine.  You have vision changes. This information is not intended to replace advice given to you by your health care provider. Make sure you discuss any questions you have with your health care provider. Document Released: 08/26/2009 Document Revised: 11/07/2015 Document Reviewed: 08/02/2012 Elsevier Interactive Patient Education  2017 Elsevier Inc.  Influenza (Flu) Vaccine (Inactivated or Recombinant): What You Need to Know  1. Why get vaccinated? Influenza ("flu") is a contagious disease that spreads around the [place] every year, usually between October and May. Flu is caused by influenza viruses, and is spread mainly by coughing, sneezing, and close contact. Anyone can get flu. Flu strikes suddenly and can last several days. Symptoms vary by age, but can include:  fever/chills  sore throat  muscle aches  fatigue  cough  headache  runny or stuffy nose  Flu can also lead to pneumonia and blood  infections, and cause diarrhea and seizures in children. If you have a medical condition, such as heart or lung disease, flu can make it worse. Flu is more dangerous for some people. Infants and young children, people 29 years of age and older, pregnant women, and people  with certain health conditions or a weakened immune system are at greatest risk. Each year thousands of people in the [place] die from flu, and many more are hospitalized. Flu vaccine can:  keep you from getting flu,  make flu less severe if you do get it, and  keep you from spreading flu to your family and other people. 2. Inactivated and recombinant flu vaccines A dose of flu vaccine is recommended every flu season. Children 6 months through 71 years of age may need two doses during the same flu season. Everyone else needs only one dose each flu season. Some inactivated flu vaccines contain a very small amount of a mercury-based preservative called thimerosal. Studies have not shown thimerosal in vaccines to be harmful, but flu vaccines that do not contain thimerosal are available. There is no live flu virus in flu shots. They cannot cause the flu. There are many flu viruses, and they are always changing. Each year a new flu vaccine is made to protect against three or four viruses that are likely to cause disease in the upcoming flu season. But even when the vaccine doesn't exactly match these viruses, it may still provide some protection. Flu vaccine cannot prevent:  flu that is caused by a virus not covered by the vaccine, or  illnesses that look like flu but are not.  It takes about 2 weeks for protection to develop after vaccination, and protection lasts through the flu season. 3. Some people should not get this vaccine Tell the person who is giving you the vaccine:  If you have any severe, life-threatening allergies. If you ever had a life-threatening allergic reaction after a dose of flu vaccine, or have a  severe allergy to any part of this vaccine, you may be advised not to get vaccinated. Most, but not all, types of flu vaccine contain a small amount of egg protein.  If you ever had Guillain-Barr Syndrome (also called GBS). Some people with a history of GBS should not get this vaccine. This should be discussed with your doctor.  If you are not feeling well. It is usually okay to get flu vaccine when you have a mild illness, but you might be asked to come back when you feel better.  4. Risks of a vaccine reaction With any medicine, including vaccines, there is a chance of reactions. These are usually mild and go away on their own, but serious reactions are also possible. Most people who get a flu shot do not have any problems with it. Minor problems following a flu shot include:  soreness, redness, or swelling where the shot was given  hoarseness  sore, red or itchy eyes  cough  fever  aches  headache  itching  fatigue  If these problems occur, they usually begin soon after the shot and last 1 or 2 days. More serious problems following a flu shot can include the following:  There may be a small increased risk of Guillain-Barre Syndrome (GBS) after inactivated flu vaccine. This risk has been estimated at 1 or 2 additional cases per million people vaccinated. This is much lower than the risk of severe complications from flu, which can be prevented by flu vaccine.  Young children who get the flu shot along with pneumococcal vaccine (PCV13) and/or DTaP vaccine at the same time might be slightly more likely to have a seizure caused by fever. Ask your doctor for more information. Tell your doctor if a child who is getting flu vaccine  has ever had a seizure.  Problems that could happen after any injected vaccine:  People sometimes faint after a medical procedure, including vaccination. Sitting or lying down for about 15 minutes can help prevent fainting, and injuries caused by a fall.  Tell your doctor if you feel dizzy, or have vision changes or ringing in the ears.  Some people get severe pain in the shoulder and have difficulty moving the arm where a shot was given. This happens very rarely.  Any medication can cause a severe allergic reaction. Such reactions from a vaccine are very rare, estimated at about 1 in a million doses, and would happen within a few minutes to a few hours after the vaccination. As with any medicine, there is a very remote chance of a vaccine causing a serious injury or death. The safety of vaccines is always being monitored. For more information, visit: http://floyd.org/www.cdc.gov/vaccinesafety/ 5. What if there is a serious reaction? What should I look for? Look for anything that concerns you, such as signs of a severe allergic reaction, very high fever, or unusual behavior. Signs of a severe allergic reaction can include hives, swelling of the face and throat, difficulty breathing, a fast heartbeat, dizziness, and weakness. These would start a few minutes to a few hours after the vaccination. What should I do?  If you think it is a severe allergic reaction or other emergency that can't wait, call 9-1-1 and get the person to the nearest hospital. Otherwise, call your doctor.  Reactions should be reported to the Vaccine Adverse Event Reporting System (VAERS). Your doctor should file this report, or you can do it yourself through the VAERS web site at www.vaers.LAgents.nohhs.gov, or by calling 1-763-394-6391. ? VAERS does not give medical advice. 6. The National Vaccine Injury Compensation Program The [employer] Vaccine Injury Compensation Program (VICP) is a federal program that was created to compensate people who may have been injured by certain vaccines. Persons who believe they may have been injured by a vaccine can learn about the program and about filing a claim by calling 1-210-264-2060 or visiting the VICP website at SpiritualWord.atwww.hrsa.gov/vaccinecompensation. There is a time  limit to file a claim for compensation. 7. How can I learn more?  Ask your healthcare provider. He or she can give you the vaccine package insert or suggest other sources of information.  Call your local or state health department.  Contact the Centers for Disease Control and Prevention (CDC): ? Call 902-166-10591-709-573-2761 (1-800-CDC-INFO) or ? Visit CDC's website at BiotechRoom.com.cywww.cdc.gov/flu Vaccine Information Statement, Inactivated Influenza Vaccine (01/19/2014) This information is not intended to replace advice given to you by your health care provider. Make sure you discuss any questions you have with your health care provider. Document Released: 03/26/2006 Document Revised: 02/20/2016 Document Reviewed: 02/20/2016 Elsevier Interactive Patient Education  2017 [employer]

## 2017-04-07 NOTE — Progress Notes (Signed)
Subjective:   Ariana [name] is a 31 y.o. G4P3003 at [redacted]w[redacted]d by LMP, early ultrasound done in clinic today being seen today for her first obstetrical visit.  Her obstetrical history is significant for term SVD, delivered in [place]. Patient does intend to breast feed. Pregnancy history fully reviewed.  Patient reports nausea, vomiting and spitting. She is already on medications for these issues.  HISTORY: Obstetric History   G4   P3   T3   P0   A0   L3    SAB0   TAB0   Ectopic0   Multiple0   Live Births3     # Outcome Date GA Lbr Len/2nd Weight Sex Delivery Anes PTL Lv  4 Current           3 Term           2 Term           1 Term              No past medical history on file. No past surgical history on file. No family history on file. Social History  Substance Use Topics  . Smoking status: Never Smoker  . Smokeless tobacco: Never Used  . Alcohol use No   No Known Allergies Current Outpatient Prescriptions on File Prior to Visit  Medication Sig Dispense Refill  . docusate sodium (COLACE) 100 MG capsule Take 1 capsule (100 mg total) by mouth daily. (Patient not taking: Reported on 04/07/2017) 30 capsule 0  . famotidine (PEPCID) 20 MG tablet Take 1 tablet (20 mg total) by mouth 2 (two) times daily. (Patient not taking: Reported on 04/07/2017) 60 tablet 0  . glycopyrrolate (ROBINUL) 1 MG tablet Take 1 tablet (1 mg total) by mouth 3 (three) times daily. (Patient not taking: Reported on 04/07/2017) 90 tablet 0  . ondansetron (ZOFRAN ODT) 4 MG disintegrating tablet Take 1 tablet (4 mg total) by mouth every 8 (eight) hours as needed for nausea or vomiting. (Patient not taking: Reported on 04/07/2017) 10 tablet 0  . Prenatal Vit-Fe Fumarate-FA (PRENATAL COMPLETE) 14-0.4 MG TABS Take 1 tablet by mouth daily. (Patient not taking: Reported on 04/07/2017) 60 each 0  . promethazine (PHENERGAN) 25 MG tablet TAKE 1 TABLET(25 MG) BY MOUTH EVERY 6 HOURS AS NEEDED FOR NAUSEA OR VOMITING (Patient  not taking: Reported on 04/07/2017) 30 tablet 0   No current facility-administered medications on file prior to visit.      Exam   Vitals:   04/07/17 1413  BP: 107/75  Pulse: (!) 102  Weight: 183 lb (83 kg)   Fetal Heart Rate (bpm): 160 on u/s  Uterus:     Pelvic Exam: Perineum: no hemorrhoids, normal perineum   Vulva: normal external genitalia, no lesions   Vagina:  normal mucosa, normal discharge   Cervix: no lesions and normal, pap smear done.    Adnexa: normal adnexa and no mass, fullness, tenderness   Bony Pelvis: average  System: General: well-developed, well-nourished female in no acute distress   Breast:  normal appearance, no masses or tenderness   Skin: normal coloration and turgor, no rashes   Neurologic: oriented, normal, negative, normal mood   Extremities: normal strength, tone, and muscle mass, ROM of all joints is normal   HEENT PERRLA, extraocular movement intact and sclera clear, anicteric   Mouth/Teeth mucous membranes moist, pharynx normal without lesions and dental hygiene good   Neck supple and no masses   Cardiovascular: regular rate and rhythm  Respiratory:  no respiratory distress, normal breath sounds   Abdomen: soft, non-tender; bowel sounds normal; no masses,  no organomegaly     Assessment:   Pregnancy: [id number] Patient Active Problem List   Diagnosis Date Noted  . Supervision of normal pregnancy 04/07/2017     Plan:  1. Need for immunization against influenza - Flu Vaccine QUAD 36+ mos IM  2. Supervision of other normal pregnancy, antepartum Initial labs drawn. - Cervicovaginal ancillary only - Cytology - PAP - Obstetric Panel, Including HIV - Hemoglobinopathy evaluation - Cystic Fibrosis Mutation 97 - Culture, OB Urine - Varicella zoster antibody, IgG - SMN1 COPY NUMBER ANALYSIS (SMA Carrier Screen) Continue prenatal vitamins. Continue antiemetics Genetic Screening discussed, Quad screen: to be ordered next visit. Ultrasound  discussed; fetal anatomic survey: to be ordered next visit. Problem list reviewed and updated. The nature [facility] with multiple MDs and other Advanced Practice Providers was explained to patient; also emphasized that residents, students are part of our team. Routine obstetric precautions reviewed. Return in about 4 weeks (around 05/05/2017) for OB Visit.     Jaynie CollinsUGONNA  Laren Orama, MD, FACOG Attending Obstetrician & Gynecologist, [facility], [facility]

## 2017-04-08 LAB — CERVICOVAGINAL ANCILLARY ONLY
CHLAMYDIA, DNA PROBE: NEGATIVE
NEISSERIA GONORRHEA: NEGATIVE
TRICH (WINDOWPATH): NEGATIVE

## 2017-04-09 LAB — OBSTETRIC PANEL, INCLUDING HIV
Antibody Screen: NEGATIVE
BASOS ABS: 0 10*3/uL (ref 0.0–0.2)
BASOS: 0 %
EOS (ABSOLUTE): 0.6 10*3/uL — AB (ref 0.0–0.4)
Eos: 10 %
HEMATOCRIT: 32.7 % — AB (ref 34.0–46.6)
HEMOGLOBIN: 10.5 g/dL — AB (ref 11.1–15.9)
HEP B S AG: NEGATIVE
HIV SCREEN 4TH GENERATION: NONREACTIVE
Immature Grans (Abs): 0 10*3/uL (ref 0.0–0.1)
Immature Granulocytes: 0 %
LYMPHS: 23 %
Lymphocytes Absolute: 1.4 10*3/uL (ref 0.7–3.1)
MCH: 28.8 pg (ref 26.6–33.0)
MCHC: 32.1 g/dL (ref 31.5–35.7)
MCV: 90 fL (ref 79–97)
MONOCYTES: 5 %
Monocytes Absolute: 0.3 10*3/uL (ref 0.1–0.9)
NEUTROS ABS: 3.8 10*3/uL (ref 1.4–7.0)
Neutrophils: 62 %
Platelets: 206 10*3/uL (ref 150–379)
RBC: 3.64 x10E6/uL — ABNORMAL LOW (ref 3.77–5.28)
RDW: 13.2 % (ref 12.3–15.4)
RPR: NONREACTIVE
RUBELLA: 21.4 {index} (ref >0.99)
Rh Factor: POSITIVE
WBC: 6.1 10*3/uL (ref 3.4–10.8)

## 2017-04-09 LAB — HEMOGLOBINOPATHY EVALUATION
HEMOGLOBIN A2 QUANTITATION: 2.7 % (ref 1.8–3.2)
HEMOGLOBIN F QUANTITATION: 0 % (ref 0.0–2.0)
HGB A: 97.3 % (ref 96.4–98.8)
HGB C: 0 %
HGB S: 0 %
HGB VARIANT: 0 %

## 2017-04-09 LAB — CYTOLOGY - PAP
Diagnosis: NEGATIVE
HPV (WINDOPATH): NOT DETECTED

## 2017-04-09 LAB — URINE CULTURE, OB REFLEX

## 2017-04-09 LAB — VARICELLA ZOSTER ANTIBODY, IGG: VARICELLA: 498 {index} (ref >165)

## 2017-04-09 LAB — CULTURE, OB URINE

## 2017-04-12 ENCOUNTER — Encounter: Payer: Self-pay | Admitting: Obstetrics & Gynecology

## 2017-04-14 LAB — CYSTIC FIBROSIS MUTATION 97: GENE DIS ANAL CARRIER INTERP BLD/T-IMP: NOT DETECTED

## 2017-04-15 LAB — SMN1 COPY NUMBER ANALYSIS (SMA CARRIER SCREENING)

## 2017-05-05 ENCOUNTER — Ambulatory Visit (INDEPENDENT_AMBULATORY_CARE_PROVIDER_SITE_OTHER): Payer: BLUE CROSS/BLUE SHIELD | Admitting: Obstetrics & Gynecology

## 2017-05-05 VITALS — BP 111/79 | HR 96 | Wt 185.0 lb

## 2017-05-05 DIAGNOSIS — M549 Dorsalgia, unspecified: Secondary | ICD-10-CM

## 2017-05-05 DIAGNOSIS — Z3490 Encounter for supervision of normal pregnancy, unspecified, unspecified trimester: Secondary | ICD-10-CM

## 2017-05-05 DIAGNOSIS — Z3482 Encounter for supervision of other normal pregnancy, second trimester: Secondary | ICD-10-CM

## 2017-05-05 DIAGNOSIS — O9989 Other specified diseases and conditions complicating pregnancy, childbirth and the puerperium: Principal | ICD-10-CM

## 2017-05-05 LAB — POCT URINALYSIS DIPSTICK
BILIRUBIN UA: NEGATIVE
GLUCOSE UA: NEGATIVE
Ketones, UA: NEGATIVE
LEUKOCYTES UA: NEGATIVE
NITRITE UA: NEGATIVE
Protein, UA: NEGATIVE
RBC UA: NEGATIVE
Spec Grav, UA: 1.01 (ref 1.010–1.025)
Urobilinogen, UA: 0.2 E.U./dL
pH, UA: 6 (ref 5.0–8.0)

## 2017-05-05 MED ORDER — ONDANSETRON 4 MG PO TBDP: 4.0000 mg | ORAL_TABLET | Freq: Three times a day (TID) | ORAL | 2 refills | Status: DC | PRN

## 2017-05-05 MED ORDER — GLYCOPYRROLATE 1 MG PO TABS: 1.0000 mg | ORAL_TABLET | Freq: Three times a day (TID) | ORAL | 0 refills | Status: DC

## 2017-05-05 MED ORDER — PROMETHAZINE HCL 25 MG PO TABS: ORAL_TABLET | ORAL | 0 refills | Status: DC

## 2017-05-05 MED ORDER — FAMOTIDINE 20 MG PO TABS: 20.0000 mg | ORAL_TABLET | Freq: Two times a day (BID) | ORAL | 0 refills | Status: DC

## 2017-05-05 NOTE — Progress Notes (Signed)
Pain in lower back and pelvic pain. Has been having headaches.  Urine dip negative

## 2017-05-05 NOTE — Patient Instructions (Signed)
Second Trimester of Pregnancy The second trimester is from week 13 through week 28, month 4 through 6. This is often the time in pregnancy that you feel your best. Often times, morning sickness has lessened or quit. You may have more energy, and you may get hungry more often. Your unborn baby (fetus) is growing rapidly. At the end of the sixth month, he or she is about 9 inches long and weighs about 1 pounds. You will likely feel the baby move (quickening) between 18 and 20 weeks of pregnancy. Follow these instructions at home:  Avoid all smoking, herbs, and alcohol. Avoid drugs not approved by your doctor.  Do not use any tobacco products, including cigarettes, chewing tobacco, and electronic cigarettes. If you need help quitting, ask your doctor. You may get counseling or other support to help you quit.  Only take medicine as told by your doctor. Some medicines are safe and some are not during pregnancy.  Exercise only as told by your doctor. Stop exercising if you start having cramps.  Eat regular, healthy meals.  Wear a good support bra if your breasts are tender.  Do not use hot tubs, steam rooms, or saunas.  Wear your seat belt when driving.  Avoid raw meat, uncooked cheese, and liter boxes and soil used by cats.  Take your prenatal vitamins.  Take 1500-2000 milligrams of calcium daily starting at the 20th week of pregnancy until you deliver your baby.  Try taking medicine that helps you poop (stool softener) as needed, and if your doctor approves. Eat more fiber by eating fresh fruit, vegetables, and whole grains. Drink enough fluids to keep your pee (urine) clear or pale yellow.  Take warm water baths (sitz baths) to soothe pain or discomfort caused by hemorrhoids. Use hemorrhoid cream if your doctor approves.  If you have puffy, bulging veins (varicose veins), wear support hose. Raise (elevate) your feet for 15 minutes, 3-4 times a day. Limit salt in your diet.  Avoid heavy  lifting, wear low heals, and sit up straight.  Rest with your legs raised if you have leg cramps or low back pain.  Visit your dentist if you have not gone during your pregnancy. Use a soft toothbrush to brush your teeth. Be gentle when you floss.  You can have sex (intercourse) unless your doctor tells you not to.  Go to your doctor visits. Get help if:  You feel dizzy.  You have mild cramps or pressure in your lower belly (abdomen).  You have a nagging pain in your belly area.  You continue to feel sick to your stomach (nauseous), throw up (vomit), or have watery poop (diarrhea).  You have bad smelling fluid coming from your vagina.  You have pain with peeing (urination). Get help right away if:  You have a fever.  You are leaking fluid from your vagina.  You have spotting or bleeding from your vagina.  You have severe belly cramping or pain.  You lose or gain weight rapidly.  You have trouble catching your breath and have chest pain.  You notice sudden or extreme puffiness (swelling) of your face, hands, ankles, feet, or legs.  You have not felt the baby move in over an hour.  You have severe headaches that do not go away with medicine.  You have vision changes. This information is not intended to replace advice given to you by your health care provider. Make sure you discuss any questions you have with your health care   provider. Document Released: 08/26/2009 Document Revised: 11/07/2015 Document Reviewed: 08/02/2012 Elsevier Interactive Patient Education  2017 Elsevier Inc.  

## 2017-05-05 NOTE — Progress Notes (Signed)
   PRENATAL VISIT NOTE  Subjective:  Ariana [name] is a 31 y.o. G4P3003 at 4460w0d being seen today for ongoing prenatal care.  She is currently monitored for the following issues for this low-risk pregnancy and has Supervision of normal pregnancy on their problem list.  Patient reports backache.  Contractions: Not present. Vag. Bleeding: None.   . Denies leaking of fluid.   The following portions of the patient's history were reviewed and updated as appropriate: allergies, current medications, past family history, past medical history, past social history, past surgical history and problem list. Problem list updated.  Objective:   Vitals:   05/05/17 1641  BP: 111/79  Pulse: 96  Weight: 185 lb (83.9 kg)    Fetal Status: Fetal Heart Rate (bpm): 156         General:  Alert, oriented and cooperative. Patient is in no acute distress.  Skin: Skin is warm and dry. No rash noted.   Cardiovascular: Normal heart rate noted  Respiratory: Normal respiratory effort, no problems with respiration noted  Abdomen: Soft, gravid, appropriate for gestational age.  Pain/Pressure: Present     Pelvic: Cervical exam deferred        Extremities: Normal range of motion.  Edema: None  Mental Status:  Normal mood and affect. Normal behavior. Normal judgment and thought content.   Assessment and Plan:  Pregnancy: G4P3003 at 5260w0d  1. Encounter for supervision of normal pregnancy, antepartum, unspecified gravidity 16 weeks - AFP, Quad Screen - US MFM OB COMP + 14 WK; Future  2. Back pain affecting pregnancy in second trimester Rd ligament and MS  pain  Preterm labor symptoms and general obstetric precautions including but not limited to vaginal bleeding, contractions, leaking of fluid and fetal movement were reviewed in detail with the patient. Please refer to After Visit Summary for other counseling recommendations.  Return in about 4 weeks (around 06/02/2017).   Scheryl DarterJames Arnold, MD

## 2017-05-08 LAB — AFP TETRA
DIA MOM VALUE: 1.65
DIA Value (EIA): 249.26 pg/mL
DSR (By Age)    1 IN: 502
DSR (Second Trimester) 1 IN: 258
GESTATIONAL AGE AFP: 16 wk
MATERNAL AGE AT EDD: 32.3 a
MSAFP MOM: 1.46
MSAFP: 46.2 ng/mL
MSHCG MOM: 1.36
MSHCG: 46223 m[IU]/mL
Osb Risk: 6095
T18 (By Age): 1:1954 {titer}
Test Results:: POSITIVE — AB
UE3 VALUE: 0.37 ng/mL
Weight: 185 [lb_av]
uE3 Mom: 0.51

## 2017-05-10 ENCOUNTER — Telehealth: Payer: Self-pay

## 2017-05-10 NOTE — Telephone Encounter (Signed)
Left message on VM to call office.

## 2017-05-10 NOTE — Telephone Encounter (Signed)
-----   Message from Adam PhenixJames G Arnold, MD sent at 05/10/2017 10:34 AM EST ----- abnl AFP tetra, needs to have dating US ( currently no US scheduled until 12/19 )

## 2017-05-19 ENCOUNTER — Telehealth: Payer: Self-pay

## 2017-05-19 DIAGNOSIS — O280 Abnormal hematological finding on antenatal screening of mother: Secondary | ICD-10-CM

## 2017-05-19 NOTE — Telephone Encounter (Signed)
US order changed to detailed

## 2017-05-19 NOTE — Telephone Encounter (Signed)
-----   Message from James G Arnold, MD sent at 05/10/2017 10:34 AM EST ----- abnl AFP tetra, needs to have dating US ( currently no US scheduled until 12/19 ) 

## 2017-05-25 ENCOUNTER — Ambulatory Visit (HOSPITAL_COMMUNITY)
Admission: RE | Admit: 2017-05-25 | Discharge: 2017-05-25 | Disposition: A | Discharge status: Home / Self Care | Payer: BLUE CROSS/BLUE SHIELD | Source: Ambulatory Visit | Attending: Obstetrics & Gynecology | Admitting: Obstetrics & Gynecology

## 2017-05-25 ENCOUNTER — Other Ambulatory Visit (HOSPITAL_COMMUNITY): Payer: Self-pay | Admitting: *Deleted

## 2017-05-25 ENCOUNTER — Encounter (HOSPITAL_COMMUNITY): Payer: Self-pay

## 2017-05-25 ENCOUNTER — Other Ambulatory Visit: Payer: Self-pay | Admitting: Obstetrics

## 2017-05-25 DIAGNOSIS — Z363 Encounter for antenatal screening for malformations: Secondary | ICD-10-CM | POA: Insufficient documentation

## 2017-05-25 DIAGNOSIS — Z3A18 18 weeks gestation of pregnancy: Secondary | ICD-10-CM | POA: Insufficient documentation

## 2017-05-25 DIAGNOSIS — Z0489 Encounter for examination and observation for other specified reasons: Secondary | ICD-10-CM

## 2017-05-25 DIAGNOSIS — O280 Abnormal hematological finding on antenatal screening of mother: Secondary | ICD-10-CM

## 2017-05-25 DIAGNOSIS — IMO0002 Reserved for concepts with insufficient information to code with codable children: Secondary | ICD-10-CM

## 2017-05-25 NOTE — Progress Notes (Signed)
Genetic Counseling  High-Risk Gestation Note  Appointment Date:  05/25/2017 Referred By: Ariana [name], Ariana [name], CNM Date of Birth:  November 17, 1985 Partner:  Ariana [name]   Pregnancy History: [id number] Estimated Date of Delivery: 10/20/17 Estimated Gestational Age: 5565w6d Attending: Charlsie MerlesMark Newman, MD   [name] and her husband, [name], were seen for genetic counseling because of an increased risk for fetal Down syndrome based on Quad screening through [facility]. [name] assisted with interpretation today as medical interpreter was not available.   In summary:  Reviewed results of Quad screening test  Increased risk for Down syndrome (1 in 258)  Discussed additional screening options  NIPS- declined  Ultrasound- performed today  Discussed diagnostic testing options  Amniocentesis- declined  Reviewed family history concerns  They were counseled regarding the screening result and the associated 1 in 256 risk for fetal Down syndrome.  We discussed chromosomes and the common features and variable prognosis of Down syndrome.  In addition, we reviewed the screen adjusted reduction in risks for trisomy 18 and open neural tube defects.  We also discussed other explanations for [name] screen positive result including: [name] gestational dating error, differences in maternal metabolism, and normal variation.  We reviewed other available screening options including noninvasive prenatal screening (NIPS)/cell free DNA (cfDNA) screening  and detailed ultrasound.  They were counseled that screening tests are used to modify [name] [name] priori risk for aneuploidy, typically based on age. This estimate provides [name] pregnancy specific risk assessment. We reviewed the benefits and limitations of each option. They were also counseled regarding diagnostic testing via amniocentesis. We reviewed the approximate 1 in 300-500 risk for complications from amniocentesis, including spontaneous pregnancy loss.  We discussed the possible results that the tests might provide including: positive, negative, unanticipated, and no result. Finally, they were counseled regarding the cost of each option and potential out of pocket expenses.   [name] was performed today. The ultrasound report will be sent under separate cover. There were no visualized fetal anomalies or markers suggestive of aneuploidy. After consideration of all the options, they declined NIPS and amniocentesis. We discussed that screening tests cannot rule out all birth defects or genetic syndromes. The patient was advised of this limitation and states she still does not want additional testing at this time.   Both family histories were reviewed and found to be noncontributory for birth defects, intellectual disability, recurrent pregnancy loss, and known genetic conditions. Without further information regarding the provided family history, an accurate genetic risk cannot be calculated. Further genetic counseling is warranted if more information is obtained.  [name] denied exposure to environmental toxins or chemical agents. She denied the use of alcohol, tobacco or street drugs. She denied significant viral illnesses during the course of her pregnancy.   I counseled this couple for approximately 35 minutes regarding the above risks and available options.   Ariana PlowmanKaren Xaiver Roskelley, MS,  Certified Genetic Counselor 05/25/2017

## 2017-06-02 ENCOUNTER — Ambulatory Visit (HOSPITAL_COMMUNITY): Payer: No Typology Code available for payment source

## 2017-06-02 ENCOUNTER — Encounter: Payer: BLUE CROSS/BLUE SHIELD | Admitting: Obstetrics and Gynecology

## 2017-06-03 ENCOUNTER — Ambulatory Visit (INDEPENDENT_AMBULATORY_CARE_PROVIDER_SITE_OTHER): Payer: BLUE CROSS/BLUE SHIELD | Admitting: Obstetrics and Gynecology

## 2017-06-03 ENCOUNTER — Encounter: Payer: Self-pay | Admitting: Obstetrics and Gynecology

## 2017-06-03 VITALS — BP 126/83 | HR 103 | Wt 189.0 lb

## 2017-06-03 DIAGNOSIS — O280 Abnormal hematological finding on antenatal screening of mother: Secondary | ICD-10-CM

## 2017-06-03 DIAGNOSIS — Z3490 Encounter for supervision of normal pregnancy, unspecified, unspecified trimester: Secondary | ICD-10-CM

## 2017-06-03 DIAGNOSIS — Z3492 Encounter for supervision of normal pregnancy, unspecified, second trimester: Secondary | ICD-10-CM

## 2017-06-03 MED ORDER — PRENATAL COMPLETE 14-0.4 MG PO TABS: 1.0000 | ORAL_TABLET | Freq: Every day | ORAL | 3 refills | Status: DC

## 2017-06-03 MED ORDER — PROMETHAZINE HCL 25 MG PO TABS: ORAL_TABLET | ORAL | 0 refills | Status: DC

## 2017-06-03 NOTE — Patient Instructions (Signed)
You may take Tylenol as needed for headache. You may take 2 regular strength or 2 extra strength tablets twice per day but do not take more than this.

## 2017-06-03 NOTE — Progress Notes (Signed)
   PRENATAL VISIT NOTE  Subjective:  Ariana [name] is a 31 y.o. [phone] at 5057w1d being seen today for ongoing prenatal care.  She is currently monitored for the following issues for this high-risk pregnancy and has Supervision of normal pregnancy; Abnormal maternal serum alpha-fetoprotein; and [redacted] weeks gestation of pregnancy on their problem list.  Patient reports occasional headache, asking what she can take.  Contractions: Irregular. Vag. Bleeding: None.  Movement: Present. Denies leaking of fluid.   The following portions of the patient's history were reviewed and updated as appropriate: allergies, current medications, past family history, past medical history, past social history, past surgical history and problem list. Problem list updated.  Objective:   Vitals:   06/03/17 1021  BP: 126/83  Pulse: (!) 103  Weight: 189 lb (85.7 kg)    Fetal Status: Fetal Heart Rate (bpm): 155   Movement: Present     General:  Alert, oriented and cooperative. Patient is in no acute distress.  Skin: Skin is warm and dry. No rash noted.   Cardiovascular: Normal heart rate noted  Respiratory: Normal respiratory effort, no problems with respiration noted  Abdomen: Soft, gravid, appropriate for gestational age.  Pain/Pressure: Absent     Pelvic: Cervical exam deferred        Extremities: Normal range of motion.  Edema: None  Mental Status:  Normal mood and affect. Normal behavior. Normal judgment and thought content.   Assessment and Plan:  Pregnancy: G4P3003 at 7157w1d  1. Encounter for supervision of normal pregnancy, antepartum, unspecified gravidity  2. Abnormal maternal serum alpha-fetoprotein Normal anatomy S/p genetic counseling, declined NIPs, amniocentesis For f/u 06/22/17  3. Headache Reviewed that she may take tylenol prn   Preterm labor symptoms and general obstetric precautions including but not limited to vaginal bleeding, contractions, leaking of fluid and fetal movement were  reviewed in detail with the patient. Please refer to After Visit Summary for other counseling recommendations.  Return in about 4 weeks (around 07/01/2017) for OB visit (MD).   Conan BowensKelly M Roza Creamer, MD

## 2017-06-15 NOTE — L&D Delivery Note (Signed)
Delivery Note At 4:44 PM a viable and healthy female was delivered via Vaginal, Spontaneous (Presentation: LOA ).  APGAR: 8, 9; weight pending .   Placenta status: spontaneous, intact.  Cord:  with the following complications: .    Anesthesia:   Episiotomy: None Lacerations: None Suture Repair: n/a Est. Blood Loss (mL): 100  Mom to postpartum.  Baby to Couplet care / Skin to Skin.  Ariana [name] is a 32 y.o. female [phone] with IUP at [redacted]w[redacted]d admitted for IOL for GDM on meds.  She progressed with augmentation to complete and pushed x 1 contraction to deliver.  Cord clamping delayed by 1-3 minutes then clamped by CNM and cut by CNM.  Placenta intact and spontaneous, bleeding minimal.  Intact perineum.  Mom and baby stable prior to transfer to postpartum. She plans on breastfeeding. She requests Depo for birth control.   Sharen Counter 10/13/2017, 5:18 PM

## 2017-06-22 ENCOUNTER — Other Ambulatory Visit (HOSPITAL_COMMUNITY): Payer: Self-pay | Admitting: Obstetrics and Gynecology

## 2017-06-22 ENCOUNTER — Ambulatory Visit (HOSPITAL_COMMUNITY)
Admission: RE | Admit: 2017-06-22 | Discharge: 2017-06-22 | Disposition: A | Discharge status: Home / Self Care | Payer: BLUE CROSS/BLUE SHIELD | Source: Ambulatory Visit | Attending: Certified Nurse Midwife | Admitting: Certified Nurse Midwife

## 2017-06-22 ENCOUNTER — Encounter (HOSPITAL_COMMUNITY): Payer: Self-pay

## 2017-06-22 DIAGNOSIS — Z0489 Encounter for examination and observation for other specified reasons: Secondary | ICD-10-CM

## 2017-06-22 DIAGNOSIS — O281 Abnormal biochemical finding on antenatal screening of mother: Secondary | ICD-10-CM

## 2017-06-22 DIAGNOSIS — Z363 Encounter for antenatal screening for malformations: Secondary | ICD-10-CM | POA: Diagnosis not present

## 2017-06-22 DIAGNOSIS — O289 Unspecified abnormal findings on antenatal screening of mother: Secondary | ICD-10-CM | POA: Diagnosis not present

## 2017-06-22 DIAGNOSIS — Z3A22 22 weeks gestation of pregnancy: Secondary | ICD-10-CM | POA: Diagnosis not present

## 2017-06-22 DIAGNOSIS — Z3490 Encounter for supervision of normal pregnancy, unspecified, unspecified trimester: Secondary | ICD-10-CM

## 2017-06-22 DIAGNOSIS — IMO0002 Reserved for concepts with insufficient information to code with codable children: Secondary | ICD-10-CM

## 2017-06-22 DIAGNOSIS — O280 Abnormal hematological finding on antenatal screening of mother: Secondary | ICD-10-CM

## 2017-06-23 ENCOUNTER — Other Ambulatory Visit (HOSPITAL_COMMUNITY): Payer: Self-pay | Admitting: *Deleted

## 2017-06-23 DIAGNOSIS — O280 Abnormal hematological finding on antenatal screening of mother: Secondary | ICD-10-CM

## 2017-07-01 ENCOUNTER — Ambulatory Visit (INDEPENDENT_AMBULATORY_CARE_PROVIDER_SITE_OTHER): Payer: BLUE CROSS/BLUE SHIELD | Admitting: Obstetrics & Gynecology

## 2017-07-01 ENCOUNTER — Encounter: Payer: Self-pay | Admitting: Obstetrics & Gynecology

## 2017-07-01 VITALS — BP 116/79 | HR 105 | Wt 196.0 lb

## 2017-07-01 DIAGNOSIS — O9989 Other specified diseases and conditions complicating pregnancy, childbirth and the puerperium: Secondary | ICD-10-CM

## 2017-07-01 DIAGNOSIS — O280 Abnormal hematological finding on antenatal screening of mother: Secondary | ICD-10-CM

## 2017-07-01 DIAGNOSIS — Z3492 Encounter for supervision of normal pregnancy, unspecified, second trimester: Secondary | ICD-10-CM

## 2017-07-01 DIAGNOSIS — O99891 Other specified diseases and conditions complicating pregnancy: Secondary | ICD-10-CM

## 2017-07-01 DIAGNOSIS — Z3490 Encounter for supervision of normal pregnancy, unspecified, unspecified trimester: Secondary | ICD-10-CM

## 2017-07-01 DIAGNOSIS — M549 Dorsalgia, unspecified: Secondary | ICD-10-CM

## 2017-07-01 NOTE — Progress Notes (Signed)
   PRENATAL VISIT NOTE  Subjective:  Ariana [name] is a 32 y.o. [phone] at 1235w1d being seen today for ongoing prenatal care.  She is currently monitored for the following issues for this high-risk pregnancy and has Supervision of normal pregnancy; Abnormal maternal serum alpha-fetoprotein; and [redacted] weeks gestation of pregnancy on their problem list.  Patient reports complains of lower pelvic pain, for the last day..  Contractions: Not present. Vag. Bleeding: None.  Movement: Present. Denies leaking of fluid.   The following portions of the patient's history were reviewed and updated as appropriate: allergies, current medications, past family history, past medical history, past social history, past surgical history and problem list. Problem list updated.  Objective:   Vitals:   07/01/17 1000  BP: 116/79  Pulse: (!) 105  Weight: 196 lb (88.9 kg)    Fetal Status: Fetal Heart Rate (bpm): 150   Movement: Present     General:  Alert, oriented and cooperative. Patient is in no acute distress.  Skin: Skin is warm and dry. No rash noted.   Cardiovascular: Normal heart rate noted  Respiratory: Normal respiratory effort, no problems with respiration noted  Abdomen: Soft, gravid, appropriate for gestational age.  Pain/Pressure: Present     Pelvic: Cervical exam performed        Extremities: Normal range of motion.  Edema: None  Mental Status:  Normal mood and affect. Normal behavior. Normal judgment and thought content.   Assessment and Plan:  Pregnancy: G4P3003 at 5735w1d  1. Encounter for supervision of normal pregnancy, antepartum, unspecified gravidity - She has another MFM u/s next month - Has seen a genetic counselor - 2 hour GTT, labs, and TDAP at next visit   Preterm labor symptoms and general obstetric precautions including but not limited to vaginal bleeding, contractions, leaking of fluid and fetal movement were reviewed in detail with the patient. Please refer to After Visit  Summary for other counseling recommendations.  No Follow-up on file.   Allie BossierMyra C Toula Miyasaki, MD

## 2017-07-01 NOTE — Progress Notes (Signed)
Needs rf on Phenergran. Low abdominal pain off/on.

## 2017-07-22 ENCOUNTER — Other Ambulatory Visit: Payer: BLUE CROSS/BLUE SHIELD

## 2017-07-22 ENCOUNTER — Ambulatory Visit (INDEPENDENT_AMBULATORY_CARE_PROVIDER_SITE_OTHER): Payer: Medicaid Other | Admitting: Obstetrics & Gynecology

## 2017-07-22 VITALS — BP 116/83 | HR 103 | Wt 197.8 lb

## 2017-07-22 DIAGNOSIS — Z3482 Encounter for supervision of other normal pregnancy, second trimester: Secondary | ICD-10-CM

## 2017-07-22 DIAGNOSIS — Z23 Encounter for immunization: Secondary | ICD-10-CM

## 2017-07-22 DIAGNOSIS — Z3480 Encounter for supervision of other normal pregnancy, unspecified trimester: Secondary | ICD-10-CM

## 2017-07-22 DIAGNOSIS — O280 Abnormal hematological finding on antenatal screening of mother: Secondary | ICD-10-CM

## 2017-07-22 NOTE — Progress Notes (Signed)
   PRENATAL VISIT NOTE  Subjective:  Ariana [name] is a 32 y.o. [phone] at 5432w1d being seen today for ongoing prenatal care.  She is currently monitored for the following issues for this low-risk pregnancy and has Supervision of normal pregnancy and Abnormal maternal serum alpha-fetoprotein on their problem list.  Patient reports no complaints.  Contractions: Not present. Vag. Bleeding: None.  Movement: Present. Denies leaking of fluid.   The following portions of the patient's history were reviewed and updated as appropriate: allergies, current medications, past family history, past medical history, past social history, past surgical history and problem list. Problem list updated.  Objective:   Vitals:   07/22/17 0817  BP: 116/83  Pulse: (!) 103  Weight: 197 lb 12.8 oz (89.7 kg)    Fetal Status: Fetal Heart Rate (bpm): 140 Fundal Height: 27 cm Movement: Present     General:  Alert, oriented and cooperative. Patient is in no acute distress.  Skin: Skin is warm and dry. No rash noted.   Cardiovascular: Normal heart rate noted  Respiratory: Normal respiratory effort, no problems with respiration noted  Abdomen: Soft, gravid, appropriate for gestational age.  Pain/Pressure: Absent     Pelvic: Cervical exam deferred        Extremities: Normal range of motion.  Edema: None  Mental Status:  Normal mood and affect. Normal behavior. Normal judgment and thought content.   Assessment and Plan:  Pregnancy: G4P3003 at 2832w1d  1. Need for Tdap vaccination - Tdap vaccine greater than or equal to 7yo IM given today  2. Abnormal maternal serum alpha-fetoprotein Still declines NIPS, had normal anatomy scan except limited spine views, rescan ordered 08/03/17.  3. Supervision of other normal pregnancy, antepartum Third trimester labs today, - Glucose Tolerance, 2 Hours w/1 Hour - CBC - HIV antibody - RPR Preterm labor symptoms and general obstetric precautions including but not limited to  vaginal bleeding, contractions, leaking of fluid and fetal movement were reviewed in detail with the patient. Please refer to After Visit Summary for other counseling recommendations.  Return in about 2 weeks (around 08/05/2017) for OB Visit.   Jaynie CollinsUgonna Weslee Fogg, MD

## 2017-07-22 NOTE — Patient Instructions (Signed)
Return to clinic for any scheduled appointments or obstetric concerns, or go to [facility] for evaluation   Third Trimester of Pregnancy The third trimester is from week 28 through week 40 (months 7 through 9). The third trimester is a time when the unborn baby (fetus) is growing rapidly. At the end of the ninth month, the fetus is about 20 inches in length and weighs 6-10 pounds. Body changes during your third trimester Your body will continue to go through many changes during pregnancy. The changes vary from woman to woman. During the third trimester:  Your weight will continue to increase. You can expect to gain 25-35 pounds (11-16 kg) by the end of the pregnancy.  You may begin to get stretch marks on your hips, abdomen, and breasts.  You may urinate more often because the fetus is moving lower into your pelvis and pressing on your bladder.  You may develop or continue to have heartburn. This is caused by increased hormones that slow down muscles in the digestive tract.  You may develop or continue to have constipation because increased hormones slow digestion and cause the muscles that push waste through your intestines to relax.  You may develop hemorrhoids. These are swollen veins (varicose veins) in the rectum that can itch or be painful.  You may develop swollen, bulging veins (varicose veins) in your legs.  You may have increased body aches in the pelvis, back, or thighs. This is due to weight gain and increased hormones that are relaxing your joints.  You may have changes in your hair. These can include thickening of your hair, rapid growth, and changes in texture. Some women also have hair loss during or after pregnancy, or hair that feels dry or thin. Your hair will most likely return to normal after your baby is born.  Your breasts will continue to grow and they will continue to become tender. A yellow fluid (colostrum) may leak from your breasts. This is the first milk you are  producing for your baby.  Your belly button may stick out.  You may notice more swelling in your hands, face, or ankles.  You may have increased tingling or numbness in your hands, arms, and legs. The skin on your belly may also feel numb.  You may feel short of breath because of your expanding uterus.  You may have more problems sleeping. This can be caused by the size of your belly, increased need to urinate, and an increase in your body's metabolism.  You may notice the fetus "dropping," or moving lower in your abdomen (lightening).  You may have increased vaginal discharge.  You may notice your joints feel loose and you may have pain around your pelvic bone.  What to expect at prenatal visits You will have prenatal exams every 2 weeks until week 36. Then you will have weekly prenatal exams. During a routine prenatal visit:  You will be weighed to make sure you and the baby are growing normally.  Your blood pressure will be taken.  Your abdomen will be measured to track your baby's growth.  The fetal heartbeat will be listened to.  Any test results from the previous visit will be discussed.  You may have a cervical check near your due date to see if your cervix has softened or thinned (effaced).  You will be tested for Group B streptococcus. This happens between 35 and 37 weeks.  Your health care provider may ask you:  What your birth plan is.    How you are feeling.  If you are feeling the baby move.  If you have had any abnormal symptoms, such as leaking fluid, bleeding, severe headaches, or abdominal cramping.  If you are using any tobacco products, including cigarettes, chewing tobacco, and electronic cigarettes.  If you have any questions.  Other tests or screenings that may be performed during your third trimester include:  Blood tests that check for low iron levels (anemia).  Fetal testing to check the health, activity level, and growth of the fetus.  Testing is done if you have certain medical conditions or if there are problems during the pregnancy.  Nonstress test (NST). This test checks the health of your baby to make sure there are no signs of problems, such as the baby not getting enough oxygen. During this test, a belt is placed around your belly. The baby is made to move, and its heart rate is monitored during movement.  What is false labor? False labor is a condition in which you feel small, irregular tightenings of the muscles in the womb (contractions) that usually go away with rest, changing position, or drinking water. These are called Braxton Hicks contractions. Contractions may last for hours, days, or even weeks before true labor sets in. If contractions come at regular intervals, become more frequent, increase in intensity, or become painful, you should see your health care provider. What are the signs of labor?  Abdominal cramps.  Regular contractions that start at 10 minutes apart and become stronger and more frequent with time.  Contractions that start on the top of the uterus and spread down to the lower abdomen and back.  Increased pelvic pressure and dull back pain.  A watery or bloody mucus discharge that comes from the vagina.  Leaking of amniotic fluid. This is also known as your "water breaking." It could be a slow trickle or a gush. Let your health care provider know if it has a color or strange odor. If you have any of these signs, call your health care provider right away, even if it is before your due date. Follow these instructions at home: Medicines  Follow your health care provider's instructions regarding medicine use. Specific medicines may be either safe or unsafe to take during pregnancy.  Take a prenatal vitamin that contains at least 600 micrograms (mcg) of folic acid.  If you develop constipation, try taking a stool softener if your health care provider approves. Eating and drinking  Eat a  balanced diet that includes fresh fruits and vegetables, whole grains, good sources of protein such as meat, eggs, or tofu, and low-fat dairy. Your health care provider will help you determine the amount of weight gain that is right for you.  Avoid raw meat and uncooked cheese. These carry germs that can cause birth defects in the baby.  If you have low calcium intake from food, talk to your health care provider about whether you should take a daily calcium supplement.  Eat four or five small meals rather than three large meals a day.  Limit foods that are high in fat and processed sugars, such as fried and sweet foods.  To prevent constipation: ? Drink enough fluid to keep your urine clear or pale yellow. ? Eat foods that are high in fiber, such as fresh fruits and vegetables, whole grains, and beans. Activity  Exercise only as directed by your health care provider. Most women can continue their usual exercise routine during pregnancy. Try to exercise for 30   minutes at least 5 days a week. Stop exercising if you experience uterine contractions.  Avoid heavy lifting.  Do not exercise in extreme heat or humidity, or at high altitudes.  Wear low-heel, comfortable shoes.  Practice good posture.  You may continue to have sex unless your health care provider tells you otherwise. Relieving pain and discomfort  Take frequent breaks and rest with your legs elevated if you have leg cramps or low back pain.  Take warm sitz baths to soothe any pain or discomfort caused by hemorrhoids. Use hemorrhoid cream if your health care provider approves.  Wear a good support bra to prevent discomfort from breast tenderness.  If you develop varicose veins: ? Wear support pantyhose or compression stockings as told by your healthcare provider. ? Elevate your feet for 15 minutes, 3-4 times a day. Prenatal care  Write down your questions. Take them to your prenatal visits.  Keep all your prenatal  visits as told by your health care provider. This is important. Safety  Wear your seat belt at all times when driving.  Make a list of emergency phone numbers, including numbers for family, friends, the hospital, and police and fire departments. General instructions  Avoid cat litter boxes and soil used by cats. These carry germs that can cause birth defects in the baby. If you have a cat, ask someone to clean the litter box for you.  Do not travel far distances unless it is absolutely necessary and only with the approval of your health care provider.  Do not use hot tubs, steam rooms, or saunas.  Do not drink alcohol.  Do not use any products that contain nicotine or tobacco, such as cigarettes and e-cigarettes. If you need help quitting, ask your health care provider.  Do not use any medicinal herbs or unprescribed drugs. These chemicals affect the formation and growth of the baby.  Do not douche or use tampons or scented sanitary pads.  Do not cross your legs for long periods of time.  To prepare for the arrival of your baby: ? Take prenatal classes to understand, practice, and ask questions about labor and delivery. ? Make a trial run to the hospital. ? Visit the hospital and tour the maternity area. ? Arrange for maternity or paternity leave through employers. ? Arrange for family and friends to take care of pets while you are in the hospital. ? Purchase a rear-facing car seat and make sure you know how to install it in your car. ? Pack your hospital bag. ? Prepare the baby's nursery. Make sure to remove all pillows and stuffed animals from the baby's crib to prevent suffocation.  Visit your dentist if you have not gone during your pregnancy. Use a soft toothbrush to brush your teeth and be gentle when you floss. Contact a health care provider if:  You are unsure if you are in labor or if your water has broken.  You become dizzy.  You have mild pelvic cramps, pelvic  pressure, or nagging pain in your abdominal area.  You have lower back pain.  You have persistent nausea, vomiting, or diarrhea.  You have an unusual or bad smelling vaginal discharge.  You have pain when you urinate. Get help right away if:  Your water breaks before 37 weeks.  You have regular contractions less than 5 minutes apart before 37 weeks.  You have a fever.  You are leaking fluid from your vagina.  You have spotting or bleeding from your vagina.    You have severe abdominal pain or cramping.  You have rapid weight loss or weight gain.  You have shortness of breath with chest pain.  You notice sudden or extreme swelling of your face, hands, ankles, feet, or legs.  Your baby makes fewer than 10 movements in 2 hours.  You have severe headaches that do not go away when you take medicine.  You have vision changes. Summary  The third trimester is from week 28 through week 40, months 7 through 9. The third trimester is a time when the unborn baby (fetus) is growing rapidly.  During the third trimester, your discomfort may increase as you and your baby continue to gain weight. You may have abdominal, leg, and back pain, sleeping problems, and an increased need to urinate.  During the third trimester your breasts will keep growing and they will continue to become tender. A yellow fluid (colostrum) may leak from your breasts. This is the first milk you are producing for your baby.  False labor is a condition in which you feel small, irregular tightenings of the muscles in the womb (contractions) that eventually go away. These are called Braxton Hicks contractions. Contractions may last for hours, days, or even weeks before true labor sets in.  Signs of labor can include: abdominal cramps; regular contractions that start at 10 minutes apart and become stronger and more frequent with time; watery or bloody mucus discharge that comes from the vagina; increased pelvic pressure  and dull back pain; and leaking of amniotic fluid. This information is not intended to replace advice given to you by your health care provider. Make sure you discuss any questions you have with your health care provider. Document Released: 05/26/2001 Document Revised: 11/07/2015 Document Reviewed: 08/02/2012 Elsevier Interactive Patient Education  2017 [employer]  

## 2017-07-23 LAB — GLUCOSE TOLERANCE, 2 HOURS W/ 1HR
GLUCOSE, FASTING: 93 mg/dL — AB (ref 65–91)
Glucose, 1 hour: 167 mg/dL (ref 65–179)
Glucose, 2 hour: 113 mg/dL (ref 65–152)

## 2017-07-23 LAB — CBC
Hematocrit: 31.8 % — ABNORMAL LOW (ref 34.0–46.6)
Hemoglobin: 9.9 g/dL — ABNORMAL LOW (ref 11.1–15.9)
MCH: 28.4 pg (ref 26.6–33.0)
MCHC: 31.1 g/dL — AB (ref 31.5–35.7)
MCV: 91 fL (ref 79–97)
PLATELETS: 215 10*3/uL (ref 150–379)
RBC: 3.49 x10E6/uL — AB (ref 3.77–5.28)
RDW: 14.7 % (ref 12.3–15.4)
WBC: 8.7 10*3/uL (ref 3.4–10.8)

## 2017-07-23 LAB — RPR: RPR Ser Ql: NONREACTIVE

## 2017-07-23 LAB — HIV ANTIBODY (ROUTINE TESTING W REFLEX): HIV Screen 4th Generation wRfx: NONREACTIVE

## 2017-07-26 ENCOUNTER — Encounter: Payer: Self-pay | Admitting: Obstetrics & Gynecology

## 2017-07-26 DIAGNOSIS — O24419 Gestational diabetes mellitus in pregnancy, unspecified control: Secondary | ICD-10-CM | POA: Insufficient documentation

## 2017-08-03 ENCOUNTER — Ambulatory Visit (HOSPITAL_COMMUNITY)
Admission: RE | Admit: 2017-08-03 | Discharge: 2017-08-03 | Disposition: A | Discharge status: Home / Self Care | Payer: Medicaid Other | Source: Ambulatory Visit | Attending: Certified Nurse Midwife | Admitting: Certified Nurse Midwife

## 2017-08-03 ENCOUNTER — Encounter (HOSPITAL_COMMUNITY): Payer: Self-pay

## 2017-08-03 DIAGNOSIS — IMO0002 Reserved for concepts with insufficient information to code with codable children: Secondary | ICD-10-CM

## 2017-08-03 DIAGNOSIS — Z3A28 28 weeks gestation of pregnancy: Secondary | ICD-10-CM | POA: Diagnosis not present

## 2017-08-03 DIAGNOSIS — Z362 Encounter for other antenatal screening follow-up: Secondary | ICD-10-CM | POA: Diagnosis not present

## 2017-08-03 DIAGNOSIS — O280 Abnormal hematological finding on antenatal screening of mother: Secondary | ICD-10-CM

## 2017-08-03 DIAGNOSIS — O289 Unspecified abnormal findings on antenatal screening of mother: Secondary | ICD-10-CM | POA: Diagnosis not present

## 2017-08-03 DIAGNOSIS — Z0489 Encounter for examination and observation for other specified reasons: Secondary | ICD-10-CM

## 2017-08-03 DIAGNOSIS — O0993 Supervision of high risk pregnancy, unspecified, third trimester: Secondary | ICD-10-CM

## 2017-08-04 ENCOUNTER — Other Ambulatory Visit (HOSPITAL_COMMUNITY): Payer: Self-pay | Admitting: *Deleted

## 2017-08-04 DIAGNOSIS — O24410 Gestational diabetes mellitus in pregnancy, diet controlled: Secondary | ICD-10-CM

## 2017-08-09 ENCOUNTER — Encounter: Payer: Medicaid Other | Admitting: Obstetrics & Gynecology

## 2017-08-10 ENCOUNTER — Other Ambulatory Visit: Payer: Self-pay

## 2017-08-10 MED ORDER — ACCU-CHEK GUIDE W/DEVICE KIT: 1.0000 | PACK | Freq: Four times a day (QID) | 0 refills | Status: DC

## 2017-08-10 MED ORDER — ACCU-CHEK FASTCLIX LANCETS MISC: 1.0000 | Freq: Four times a day (QID) | 12 refills | Status: DC

## 2017-08-10 MED ORDER — GLUCOSE BLOOD VI STRP: ORAL_STRIP | 12 refills | Status: DC

## 2017-08-20 ENCOUNTER — Other Ambulatory Visit: Payer: Self-pay | Admitting: Obstetrics and Gynecology

## 2017-08-20 DIAGNOSIS — Z3490 Encounter for supervision of normal pregnancy, unspecified, unspecified trimester: Secondary | ICD-10-CM

## 2017-08-24 ENCOUNTER — Telehealth: Payer: Self-pay | Admitting: *Deleted

## 2017-08-24 NOTE — Telephone Encounter (Signed)
Refill request from pharmacy for Promethazine 25mg  tablets.  Please send refill if approved.

## 2017-08-25 ENCOUNTER — Other Ambulatory Visit: Payer: Self-pay | Admitting: *Deleted

## 2017-08-25 ENCOUNTER — Ambulatory Visit (INDEPENDENT_AMBULATORY_CARE_PROVIDER_SITE_OTHER): Payer: Medicaid Other | Admitting: Obstetrics and Gynecology

## 2017-08-25 ENCOUNTER — Encounter: Payer: Self-pay | Admitting: Obstetrics and Gynecology

## 2017-08-25 VITALS — BP 116/77 | HR 105 | Wt 203.4 lb

## 2017-08-25 DIAGNOSIS — O280 Abnormal hematological finding on antenatal screening of mother: Secondary | ICD-10-CM

## 2017-08-25 DIAGNOSIS — O24410 Gestational diabetes mellitus in pregnancy, diet controlled: Secondary | ICD-10-CM

## 2017-08-25 DIAGNOSIS — Z3490 Encounter for supervision of normal pregnancy, unspecified, unspecified trimester: Secondary | ICD-10-CM

## 2017-08-25 DIAGNOSIS — O0993 Supervision of high risk pregnancy, unspecified, third trimester: Secondary | ICD-10-CM

## 2017-08-25 MED ORDER — GLYCOPYRROLATE 1 MG PO TABS: 1.0000 mg | ORAL_TABLET | Freq: Three times a day (TID) | ORAL | 1 refills | Status: DC

## 2017-08-25 NOTE — Progress Notes (Signed)
Subjective:  Ariana [name] is a 32 y.o. [phone] at 5518w0d being seen today for ongoing prenatal care.  She is currently monitored for the following issues for this high-risk pregnancy and has Supervision of high risk pregnancy, antepartum, third trimester; Abnormal maternal serum alpha-fetoprotein; and Gestational diabetes mellitus (GDM) in third trimester on their problem list.  Patient reports no complaints.  Contractions: Not present. Vag. Bleeding: None.  Movement: Present. Denies leaking of fluid.   The following portions of the patient's history were reviewed and updated as appropriate: allergies, current medications, past family history, past medical history, past social history, past surgical history and problem list. Problem list updated.  Objective:   Vitals:   08/25/17 1632  BP: 116/77  Pulse: (!) 105  Weight: 203 lb 6.4 oz (92.3 kg)    Fetal Status: Fetal Heart Rate (bpm): 156   Movement: Present     General:  Alert, oriented and cooperative. Patient is in no acute distress.  Skin: Skin is warm and dry. No rash noted.   Cardiovascular: Normal heart rate noted  Respiratory: Normal respiratory effort, no problems with respiration noted  Abdomen: Soft, gravid, appropriate for gestational age. Pain/Pressure: Present     Pelvic:  Cervical exam deferred        Extremities: Normal range of motion.  Edema: None  Mental Status: Normal mood and affect. Normal behavior. Normal judgment and thought content.   Urinalysis:      Assessment and Plan:  Pregnancy: G4P3003 at 9618w0d  1. Supervision of high risk pregnancy, antepartum, third trimester Stable  2. Diet controlled gestational diabetes mellitus (GDM) in third trimester GDM and pregnancy reviewed with pt CBG's in goal range Continue with diet - Referral to Nutrition and Diabetes Services  3. Abnormal maternal serum alpha-fetoprotein U/S 2/19 normal  Preterm labor symptoms and general obstetric precautions including but not  limited to vaginal bleeding, contractions, leaking of fluid and fetal movement were reviewed in detail with the patient. Please refer to After Visit Summary for other counseling recommendations.  No Follow-up on file.   Hermina StaggersErvin, Randy Whitener L, MD

## 2017-08-25 NOTE — Patient Instructions (Signed)

## 2017-08-25 NOTE — Progress Notes (Signed)
Refill request on [facility] sent per Dr Debroah LoopArnold approval.

## 2017-08-27 ENCOUNTER — Other Ambulatory Visit: Payer: Self-pay | Admitting: Obstetrics & Gynecology

## 2017-08-27 DIAGNOSIS — Z3490 Encounter for supervision of normal pregnancy, unspecified, unspecified trimester: Secondary | ICD-10-CM

## 2017-08-27 MED ORDER — PROMETHAZINE HCL 25 MG PO TABS: ORAL_TABLET | ORAL | 0 refills | Status: DC

## 2017-08-31 ENCOUNTER — Ambulatory Visit (HOSPITAL_COMMUNITY): Payer: Medicaid Other

## 2017-09-01 ENCOUNTER — Ambulatory Visit (HOSPITAL_COMMUNITY)
Admission: RE | Admit: 2017-09-01 | Discharge: 2017-09-01 | Disposition: A | Discharge status: Home / Self Care | Payer: Medicaid Other | Source: Ambulatory Visit | Attending: Certified Nurse Midwife | Admitting: Certified Nurse Midwife

## 2017-09-01 ENCOUNTER — Encounter (HOSPITAL_COMMUNITY): Payer: Self-pay

## 2017-09-01 DIAGNOSIS — O24410 Gestational diabetes mellitus in pregnancy, diet controlled: Secondary | ICD-10-CM | POA: Insufficient documentation

## 2017-09-01 DIAGNOSIS — Z362 Encounter for other antenatal screening follow-up: Secondary | ICD-10-CM | POA: Insufficient documentation

## 2017-09-01 DIAGNOSIS — Z3A33 33 weeks gestation of pregnancy: Secondary | ICD-10-CM | POA: Diagnosis present

## 2017-09-01 DIAGNOSIS — O289 Unspecified abnormal findings on antenatal screening of mother: Secondary | ICD-10-CM | POA: Diagnosis not present

## 2017-09-01 DIAGNOSIS — O0993 Supervision of high risk pregnancy, unspecified, third trimester: Secondary | ICD-10-CM

## 2017-09-01 DIAGNOSIS — O280 Abnormal hematological finding on antenatal screening of mother: Secondary | ICD-10-CM

## 2017-09-02 ENCOUNTER — Other Ambulatory Visit (HOSPITAL_COMMUNITY): Payer: Self-pay | Admitting: *Deleted

## 2017-09-02 DIAGNOSIS — O24410 Gestational diabetes mellitus in pregnancy, diet controlled: Secondary | ICD-10-CM

## 2017-09-08 ENCOUNTER — Ambulatory Visit (INDEPENDENT_AMBULATORY_CARE_PROVIDER_SITE_OTHER): Payer: Medicaid Other | Admitting: Obstetrics and Gynecology

## 2017-09-08 ENCOUNTER — Encounter: Payer: Self-pay | Admitting: Obstetrics and Gynecology

## 2017-09-08 VITALS — BP 126/83 | HR 103 | Wt 208.2 lb

## 2017-09-08 DIAGNOSIS — O24410 Gestational diabetes mellitus in pregnancy, diet controlled: Secondary | ICD-10-CM

## 2017-09-08 DIAGNOSIS — O0993 Supervision of high risk pregnancy, unspecified, third trimester: Secondary | ICD-10-CM

## 2017-09-08 DIAGNOSIS — O280 Abnormal hematological finding on antenatal screening of mother: Secondary | ICD-10-CM

## 2017-09-08 NOTE — Progress Notes (Signed)
Pt c/o low back pain and R thigh pain. Did not try OTC pain meds. Pt forgot CBG log at home.

## 2017-09-08 NOTE — Patient Instructions (Signed)
Back Pain in Pregnancy Back pain during pregnancy is common. Back pain may be caused by several factors that are related to changes during your pregnancy. Follow these instructions at home: Managing pain, stiffness, and swelling  If directed, apply ice for sudden (acute) back pain. ? Put ice in a plastic bag. ? Place a towel between your skin and the bag. ? Leave the ice on for 20 minutes, 2-3 times per day.  If directed, apply heat to the affected area before you exercise: ? Place a towel between your skin and the heat pack or heating pad. ? Leave the heat on for 20-30 minutes. ? Remove the heat if your skin turns bright red. This is especially important if you are unable to feel pain, heat, or cold. You may have a greater risk of getting burned. Activity  Exercise as told by your health care provider. Exercising is the best way to prevent or manage back pain.  Listen to your body when lifting. If lifting hurts, ask for help or bend your knees. This uses your leg muscles instead of your back muscles.  Squat down when picking up something from the floor. Do not bend over.  Only use bed rest as told by your health care provider. Bed rest should only be used for the most severe episodes of back pain. Standing, Sitting, and Lying Down  Do not stand in one place for long periods of time.  Use good posture when sitting. Make sure your head rests over your shoulders and is not hanging forward. Use a pillow on your lower back if necessary.  Try sleeping on your side, preferably the left side, with a pillow or two between your legs. If you are sore after a night's rest, your bed may be too soft. A firm mattress may provide more support for your back during pregnancy. General instructions  Do not wear high heels.  Eat a healthy diet. Try to gain weight within your health care provider's recommendations.  Use a maternity girdle, elastic sling, or back brace as told by your health care  provider.  Take over-the-counter and prescription medicines only as told by your health care provider.  Keep all follow-up visits as told by your health care provider. This is important. This includes any visits with any specialists, such as a physical therapist. Contact a health care provider if:  Your back pain interferes with your daily activities.  You have increasing pain in other parts of your body. Get help right away if:  You develop numbness, tingling, weakness, or problems with the use of your arms or legs.  You develop severe back pain that is not controlled with medicine.  You have a sudden change in bowel or bladder control.  You develop shortness of breath, dizziness, or you faint.  You develop nausea, vomiting, or sweating.  You have back pain that is a rhythmic, cramping pain similar to labor pains. Labor pain is usually 1-2 minutes apart, lasts for about 1 minute, and involves a bearing down feeling or pressure in your pelvis.  You have back pain and your water breaks or you have vaginal bleeding.  You have back pain or numbness that travels down your leg.  Your back pain developed after you fell.  You develop pain on one side of your back.  You see blood in your urine.  You develop skin blisters in the area of your back pain. This information is not intended to replace advice given to you   by your health care provider. Make sure you discuss any questions you have with your health care provider. Document Released: 09/09/2005 Document Revised: 11/07/2015 Document Reviewed: 02/13/2015 Elsevier Interactive Patient Education  2018 [employer]  

## 2017-09-08 NOTE — Progress Notes (Signed)
Subjective:  Ariana [name] is a 32 y.o. [phone] at 2180w0d being seen today for ongoing prenatal care.  She is currently monitored for the following issues for this high-risk pregnancy and has Supervision of high risk pregnancy, antepartum, third trimester; Abnormal maternal serum alpha-fetoprotein; and Gestational diabetes mellitus (GDM) in third trimester on their problem list.  Patient reports general discomforts of pregnancy.  Contractions: Not present. Vag. Bleeding: None.  Movement: Present. Denies leaking of fluid.   The following portions of the patient's history were reviewed and updated as appropriate: allergies, current medications, past family history, past medical history, past social history, past surgical history and problem list. Problem list updated.  Objective:   Vitals:   09/08/17 1621  BP: 126/83  Pulse: (!) 103  Weight: 94.4 kg (208 lb 3.2 oz)    Fetal Status: Fetal Heart Rate (bpm): 152    Movement: Present     General:  Alert, oriented and cooperative. Patient is in no acute distress.  Skin: Skin is warm and dry. No rash noted.   Cardiovascular: Normal heart rate noted  Respiratory: Normal respiratory effort, no problems with respiration noted  Abdomen: Soft, gravid, appropriate for gestational age. Pain/Pressure: Present     Pelvic:  Cervical exam performed        Extremities: Normal range of motion.  Edema: None  Mental Status: Normal mood and affect. Normal behavior. Normal judgment and thought content.   Urinalysis:      Assessment and Plan:  Pregnancy: G4P3003 at 7980w0d  1. Supervision of high risk pregnancy, antepartum, third trimester Stable Vaginal cultures next visit List of OTC meds provided to pt as well as reviewed Tx options for back pain in pregnancy  2. Diet controlled gestational diabetes mellitus (GDM) in third trimester Did not bring in CBG log but reports CBS in goal range Has not seen Diabetic educator will assist in that appt today  3.  Abnormal maternal serum alpha-fetoprotein  Preterm labor symptoms and general obstetric precautions including but not limited to vaginal bleeding, contractions, leaking of fluid and fetal movement were reviewed in detail with the patient. Please refer to After Visit Summary for other counseling recommendations.  Return in about 2 weeks (around 09/22/2017) for OB visit.   Hermina StaggersErvin, Ralf Konopka L, MD

## 2017-09-10 ENCOUNTER — Encounter: Payer: Medicaid Other | Attending: Obstetrics and Gynecology | Admitting: *Deleted

## 2017-09-10 ENCOUNTER — Encounter: Payer: Self-pay | Admitting: *Deleted

## 2017-09-10 DIAGNOSIS — Z3A00 Weeks of gestation of pregnancy not specified: Secondary | ICD-10-CM | POA: Diagnosis not present

## 2017-09-10 DIAGNOSIS — Z713 Dietary counseling and surveillance: Secondary | ICD-10-CM | POA: Diagnosis present

## 2017-09-10 DIAGNOSIS — O24410 Gestational diabetes mellitus in pregnancy, diet controlled: Secondary | ICD-10-CM | POA: Diagnosis not present

## 2017-09-10 NOTE — Progress Notes (Signed)
  Patient was seen on 09/10/2017 for Gestational Diabetes self-management. EDD 10/20/2017. Patient here with her husband and 33 yo son. They state they moved here from [place] 2 years ago. Patient states no history of GDM. Diet history obtained. Patient eats good variety of all food groups and chooses African foods typically so far. Beverages include only water.  The following learning objectives were met by the patient :   States the definition of Gestational Diabetes  States why dietary management is important in controlling blood glucose  Describes the effects of carbohydrates on blood glucose levels  Demonstrates ability to create a balanced meal plan  Demonstrates carbohydrate counting   States when to check blood glucose levels  Demonstrates proper blood glucose monitoring techniques  States the effect of stress and exercise on blood glucose levels  States the importance of limiting caffeine and abstaining from alcohol and smoking  Plan:  Aim for 3 Carb Choices per meal (45 grams) +/- 1 either way  Aim for 1-2 Carbs per snack Begin reading food labels for Total Carbohydrate of foods Consider  increasing your activity level by walking or other activity daily as tolerated Begin checking BG before breakfast and 2 hours after first bite of breakfast, lunch and dinner as directed by MD  Bring Log Book/Sheet to every medical appointment   Patient not appropriate for Baby Scripts due to some language and literacy barriers  Take medication if directed by MD  Patient already has a meter: Accu Chek Guide And is testing pre breakfast and 2 hours each meal as directed by MD Review of Log Book shows: Fasting BG typically below 95 mg/dl and post meal BG range 100-130 mg/dl prior to instruction on food.  Patient instructed to monitor glucose levels: FBS: 60 - 95 mg/dl 2 hour: <120 mg/dl  Patient received the following handouts:  Nutrition Diabetes and Pregnancy  Carbohydrate Counting  List  Patient will be seen for follow-up in 2 weeks and then as needed.

## 2017-09-22 ENCOUNTER — Ambulatory Visit (INDEPENDENT_AMBULATORY_CARE_PROVIDER_SITE_OTHER): Payer: Medicaid Other | Admitting: Obstetrics and Gynecology

## 2017-09-22 ENCOUNTER — Other Ambulatory Visit (HOSPITAL_COMMUNITY)
Admission: RE | Admit: 2017-09-22 | Discharge: 2017-09-22 | Disposition: A | Discharge status: Home / Self Care | Payer: Medicaid Other | Source: Ambulatory Visit | Attending: Obstetrics and Gynecology | Admitting: Obstetrics and Gynecology

## 2017-09-22 ENCOUNTER — Ambulatory Visit: Payer: Medicaid Other | Admitting: *Deleted

## 2017-09-22 ENCOUNTER — Encounter: Payer: Self-pay | Admitting: Obstetrics and Gynecology

## 2017-09-22 VITALS — BP 121/85 | HR 101 | Wt 205.8 lb

## 2017-09-22 DIAGNOSIS — Z3A36 36 weeks gestation of pregnancy: Secondary | ICD-10-CM | POA: Diagnosis not present

## 2017-09-22 DIAGNOSIS — O0993 Supervision of high risk pregnancy, unspecified, third trimester: Secondary | ICD-10-CM

## 2017-09-22 DIAGNOSIS — O280 Abnormal hematological finding on antenatal screening of mother: Secondary | ICD-10-CM

## 2017-09-22 DIAGNOSIS — O24410 Gestational diabetes mellitus in pregnancy, diet controlled: Secondary | ICD-10-CM

## 2017-09-22 MED ORDER — METFORMIN HCL 500 MG PO TABS: 500.0000 mg | ORAL_TABLET | Freq: Two times a day (BID) | ORAL | 5 refills | Status: DC

## 2017-09-22 NOTE — Progress Notes (Signed)
Subjective:  Ariana [name] is a [age] y.o. [phone] at 359w0d being seen today for ongoing prenatal care.  She is currently monitored for the following issues for this high-risk pregnancy and has Supervision of high risk pregnancy, antepartum, third trimester; Abnormal maternal serum alpha-fetoprotein; and Gestational diabetes mellitus (GDM) in third trimester on their problem list.  Patient reports no complaints.  Contractions: Not present. Vag. Bleeding: None.  Movement: Present. Denies leaking of fluid.   The following portions of the patient's history were reviewed and updated as appropriate: allergies, current medications, past family history, past medical history, past social history, past surgical history and problem list. Problem list updated.  Objective:   Vitals:   09/22/17 1626  BP: 121/85  Pulse: (!) 101  Weight: 205 lb 12.8 oz (93.4 kg)    Fetal Status: Fetal Heart Rate (bpm): 160   Movement: Present     General:  Alert, oriented and cooperative. Patient is in no acute distress.  Skin: Skin is warm and dry. No rash noted.   Cardiovascular: Normal heart rate noted  Respiratory: Normal respiratory effort, no problems with respiration noted  Abdomen: Soft, gravid, appropriate for gestational age. Pain/Pressure: Present     Pelvic:  Cervical exam performed        Extremities: Normal range of motion.  Edema: None  Mental Status: Normal mood and affect. Normal behavior. Normal judgment and thought content.   Urinalysis:      Assessment and Plan:  Pregnancy: G4P3003 at 7859w0d  1. Supervision of high risk pregnancy, antepartum, third trimester Stable - Strep Gp B NAA - Cervicovaginal ancillary only  2. Diet controlled gestational diabetes mellitus (GDM) in third trimester Several CBG's not in goal range Will start Metformin 500 mg bid Add BPP to U/S next week and NST with OB visit - metFORMIN (GLUCOPHAGE) 500 MG tablet; Take 1 tablet (500 mg total) by mouth 2 (two) times daily  with a meal.  Dispense: 60 tablet; Refill: 5  3. Abnormal maternal serum alpha-fetoprotein Declined additional testing  Term labor symptoms and general obstetric precautions including but not limited to vaginal bleeding, contractions, leaking of fluid and fetal movement were reviewed in detail with the patient. Please refer to After Visit Summary for other counseling recommendations.  Return in about 1 week (around 09/29/2017) for OB visit.   Hermina StaggersErvin, Kimie Pidcock L, MD

## 2017-09-22 NOTE — Progress Notes (Signed)
Patient reports fetal movement with occasional, back, leg and abdominal pain, denies bleeding.

## 2017-09-23 NOTE — Addendum Note (Signed)
Addended by: Natale MilchSTALLING, Pavel Gadd D on: 09/23/2017 08:02 AM   Modules accepted: Orders

## 2017-09-24 LAB — CERVICOVAGINAL ANCILLARY ONLY
Bacterial vaginitis: NEGATIVE
CANDIDA VAGINITIS: NEGATIVE
CHLAMYDIA, DNA PROBE: NEGATIVE
Neisseria Gonorrhea: NEGATIVE
TRICH (WINDOWPATH): NEGATIVE

## 2017-09-24 LAB — STREP GP B NAA: Strep Gp B NAA: NEGATIVE

## 2017-09-29 ENCOUNTER — Other Ambulatory Visit (HOSPITAL_COMMUNITY): Payer: Self-pay | Admitting: Obstetrics and Gynecology

## 2017-09-29 ENCOUNTER — Other Ambulatory Visit: Payer: Self-pay | Admitting: Obstetrics and Gynecology

## 2017-09-29 ENCOUNTER — Encounter (HOSPITAL_COMMUNITY): Payer: Self-pay

## 2017-09-29 ENCOUNTER — Ambulatory Visit (HOSPITAL_COMMUNITY)
Admission: RE | Admit: 2017-09-29 | Discharge: 2017-09-29 | Disposition: A | Discharge status: Home / Self Care | Payer: Medicaid Other | Source: Ambulatory Visit | Attending: Certified Nurse Midwife | Admitting: Certified Nurse Midwife

## 2017-09-29 DIAGNOSIS — O289 Unspecified abnormal findings on antenatal screening of mother: Secondary | ICD-10-CM | POA: Insufficient documentation

## 2017-09-29 DIAGNOSIS — O0993 Supervision of high risk pregnancy, unspecified, third trimester: Secondary | ICD-10-CM | POA: Diagnosis not present

## 2017-09-29 DIAGNOSIS — Z3A37 37 weeks gestation of pregnancy: Secondary | ICD-10-CM

## 2017-09-29 DIAGNOSIS — O24419 Gestational diabetes mellitus in pregnancy, unspecified control: Secondary | ICD-10-CM | POA: Insufficient documentation

## 2017-09-29 DIAGNOSIS — O24410 Gestational diabetes mellitus in pregnancy, diet controlled: Secondary | ICD-10-CM

## 2017-09-29 DIAGNOSIS — O280 Abnormal hematological finding on antenatal screening of mother: Secondary | ICD-10-CM

## 2017-09-29 NOTE — ED Notes (Signed)
Patient has not started Metformin. Wanted to make sure that it would not harm the baby. Reassured patient that this will not harm the baby, that it will help lower her blood sugar and she should begin taking it today.

## 2017-09-30 ENCOUNTER — Encounter: Payer: Self-pay | Admitting: Obstetrics and Gynecology

## 2017-09-30 ENCOUNTER — Other Ambulatory Visit: Payer: Medicaid Other

## 2017-09-30 ENCOUNTER — Ambulatory Visit (INDEPENDENT_AMBULATORY_CARE_PROVIDER_SITE_OTHER): Payer: Medicaid Other | Admitting: Obstetrics and Gynecology

## 2017-09-30 VITALS — BP 125/85 | HR 98 | Wt 209.1 lb

## 2017-09-30 DIAGNOSIS — O24415 Gestational diabetes mellitus in pregnancy, controlled by oral hypoglycemic drugs: Secondary | ICD-10-CM

## 2017-09-30 DIAGNOSIS — O280 Abnormal hematological finding on antenatal screening of mother: Secondary | ICD-10-CM

## 2017-09-30 DIAGNOSIS — O0993 Supervision of high risk pregnancy, unspecified, third trimester: Secondary | ICD-10-CM

## 2017-09-30 NOTE — Progress Notes (Signed)
   PRENATAL VISIT NOTE  Subjective:  Ariana [name] is a 32 y.o. [phone] at 5386w1d being seen today for ongoing prenatal care.  She is currently monitored for the following issues for this high-risk pregnancy and has Supervision of high risk pregnancy, antepartum, third trimester; Abnormal maternal serum alpha-fetoprotein; and Gestational diabetes mellitus (GDM) in third trimester on their problem list.  Patient reports occasional contractions.  Contractions: Not present. Vag. Bleeding: None.  Movement: Present. Denies leaking of fluid.   The following portions of the patient's history were reviewed and updated as appropriate: allergies, current medications, past family history, past medical history, past social history, past surgical history and problem list. Problem list updated.  Objective:   Vitals:   09/30/17 1528  BP: 125/85  Pulse: 98  Weight: 209 lb 1.6 oz (94.8 kg)    Fetal Status: Fetal Heart Rate (bpm): NST   Movement: Present     General:  Alert, oriented and cooperative. Patient is in no acute distress.  Skin: Skin is warm and dry. No rash noted.   Cardiovascular: Normal heart rate noted  Respiratory: Normal respiratory effort, no problems with respiration noted  Abdomen: Soft, gravid, appropriate for gestational age.  Pain/Pressure: Present     Pelvic: Cervical exam deferred        Extremities: Normal range of motion.  Edema: None  Mental Status: Normal mood and affect. Normal behavior. Normal judgment and thought content.   Assessment and Plan:  Pregnancy: G4P3003 at 3386w1d  1. Gestational diabetes mellitus (GDM) in third trimester controlled on oral hypoglycemic drug Metformin 500 mg BID FG 80s-90s, one 96, PP 110s-120s Growth 4/17 88th%tile For IOL 39 weeks, scheduled today NST today reactive  2. Supervision of high risk pregnancy, antepartum, third trimester  3. Abnormal maternal serum alpha-fetoprotein Declined additional testing  Term labor symptoms and  general obstetric precautions including but not limited to vaginal bleeding, contractions, leaking of fluid and fetal movement were reviewed in detail with the patient. Please refer to After Visit Summary for other counseling recommendations.  Return in about 1 week (around 10/07/2017) for OB visit (MD).  No future appointments.  Conan BowensKelly M Davis, MD

## 2017-10-01 ENCOUNTER — Telehealth (HOSPITAL_COMMUNITY): Payer: Self-pay | Admitting: *Deleted

## 2017-10-01 NOTE — Telephone Encounter (Signed)
Preadmission screen  

## 2017-10-04 ENCOUNTER — Ambulatory Visit (INDEPENDENT_AMBULATORY_CARE_PROVIDER_SITE_OTHER): Payer: Medicaid Other | Admitting: Obstetrics and Gynecology

## 2017-10-04 ENCOUNTER — Encounter: Payer: Self-pay | Admitting: Obstetrics and Gynecology

## 2017-10-04 ENCOUNTER — Other Ambulatory Visit: Payer: Medicaid Other

## 2017-10-04 VITALS — BP 127/86 | HR 101 | Wt 208.4 lb

## 2017-10-04 DIAGNOSIS — O24415 Gestational diabetes mellitus in pregnancy, controlled by oral hypoglycemic drugs: Secondary | ICD-10-CM

## 2017-10-04 DIAGNOSIS — O280 Abnormal hematological finding on antenatal screening of mother: Secondary | ICD-10-CM

## 2017-10-04 DIAGNOSIS — O0993 Supervision of high risk pregnancy, unspecified, third trimester: Secondary | ICD-10-CM

## 2017-10-04 NOTE — Progress Notes (Signed)
   PRENATAL VISIT NOTE  Subjective:  Ariana [name] is a 32 y.o. [phone] at 5318w5d being seen today for ongoing prenatal care.  She is currently monitored for the following issues for this high-risk pregnancy and has Supervision of high risk pregnancy, antepartum, third trimester; Abnormal maternal serum alpha-fetoprotein; and Gestational diabetes mellitus (GDM) in third trimester on their problem list.  Patient reports no complaints.  Contractions: Not present. Vag. Bleeding: None.  Movement: Present. Denies leaking of fluid.   The following portions of the patient's history were reviewed and updated as appropriate: allergies, current medications, past family history, past medical history, past social history, past surgical history and problem list. Problem list updated.  Objective:   Vitals:   10/04/17 1457  BP: 127/86  Pulse: (!) 101  Weight: 208 lb 6.4 oz (94.5 kg)    Fetal Status: Fetal Heart Rate (bpm): NST/AFI   Movement: Present     General:  Alert, oriented and cooperative. Patient is in no acute distress.  Skin: Skin is warm and dry. No rash noted.   Cardiovascular: Normal heart rate noted  Respiratory: Normal respiratory effort, no problems with respiration noted  Abdomen: Soft, gravid, appropriate for gestational age.  Pain/Pressure: Present     Pelvic: Cervical exam deferred        Extremities: Normal range of motion.  Edema: None  Mental Status: Normal mood and affect. Normal behavior. Normal judgment and thought content.   Assessment and Plan:  Pregnancy: G4P3003 at 7218w5d  1. Supervision of high risk pregnancy, antepartum, third trimester Patient is doing well without complaints  2. Gestational diabetes mellitus (GDM) in third trimester controlled on oral hypoglycemic drug CBGs reviewed and great majority within range Continue metformin NST reviewed and reactive with baseline 140, mod variability, +accels, no decels IOL scheduled at 39 weeks. Patient will be  scheduled for in office outpatient cervical foley placement - Fetal nonstress test - US OB Limited  3. Abnormal maternal serum alpha-fetoprotein   Term labor symptoms and general obstetric precautions including but not limited to vaginal bleeding, contractions, leaking of fluid and fetal movement were reviewed in detail with the patient. Please refer to After Visit Summary for other counseling recommendations.  Return in about 8 days (around 10/12/2017) for cervical foley placement.  Future Appointments  Date Time Provider Department Center  10/07/2017  2:30 PM [facility] None  10/13/2017  7:30 AM [facility] None    Catalina AntiguaPeggy Alfonzia Woolum, MD

## 2017-10-07 ENCOUNTER — Ambulatory Visit (INDEPENDENT_AMBULATORY_CARE_PROVIDER_SITE_OTHER): Payer: Medicaid Other

## 2017-10-07 DIAGNOSIS — O24415 Gestational diabetes mellitus in pregnancy, controlled by oral hypoglycemic drugs: Secondary | ICD-10-CM

## 2017-10-07 NOTE — Progress Notes (Signed)
NST ONLY   B/P:133/86 P: 109   Start time : 2:46pm  Finish time: 3:30 pm

## 2017-10-08 NOTE — Progress Notes (Signed)
I have reviewed the chart and agree with nursing staff's documentation of this patient's encounter.  Roe CoombsRachelle A Tkai Large, CNM 10/08/2017 8:04 AM

## 2017-10-12 ENCOUNTER — Ambulatory Visit (INDEPENDENT_AMBULATORY_CARE_PROVIDER_SITE_OTHER): Payer: Medicaid Other | Admitting: Obstetrics & Gynecology

## 2017-10-12 VITALS — BP 139/92 | HR 102 | Wt 208.8 lb

## 2017-10-12 DIAGNOSIS — O24415 Gestational diabetes mellitus in pregnancy, controlled by oral hypoglycemic drugs: Secondary | ICD-10-CM | POA: Diagnosis not present

## 2017-10-12 DIAGNOSIS — O0993 Supervision of high risk pregnancy, unspecified, third trimester: Secondary | ICD-10-CM

## 2017-10-12 DIAGNOSIS — Z3490 Encounter for supervision of normal pregnancy, unspecified, unspecified trimester: Secondary | ICD-10-CM

## 2017-10-12 NOTE — Progress Notes (Signed)
PRENATAL VISIT NOTE  Subjective:  Ariana [name] is a 32 y.o. [phone] at [redacted]w[redacted]d being seen today for ongoing prenatal care.  She is currently monitored for the following issues for this high-risk pregnancy and has Supervision of high risk pregnancy, antepartum, third trimester; Abnormal maternal serum alpha-fetoprotein; and Gestational diabetes mellitus (GDM) in third trimester on their problem list.  Patient reports no complaints.  Contractions: Not present. Vag. Bleeding: None.  Movement: Present. Denies leaking of fluid.   The following portions of the patient's history were reviewed and updated as appropriate: allergies, current medications, past family history, past medical history, past social history, past surgical history and problem list. Problem list updated.  Objective:   Vitals:   10/12/17 0946  BP: (!) 139/92  Pulse: (!) 102  Weight: 208 lb 12.8 oz (94.7 kg)    Fetal Status: Fetal Heart Rate (bpm): NST   Movement: Present     General:  Alert, oriented and cooperative. Patient is in no acute distress.  Skin: Skin is warm and dry. No rash noted.   Cardiovascular: Normal heart rate noted  Respiratory: Normal respiratory effort, no problems with respiration noted  Abdomen: Soft, gravid, appropriate for gestational age.  Pain/Pressure: Present     Pelvic: Cervical exam performed        Extremities: Normal range of motion.  Edema: Trace  Mental Status: Normal mood and affect. Normal behavior. Normal judgment and thought content.   Assessment and Plan:  Pregnancy: G4P3003 at [redacted]w[redacted]d  1. Supervision of high risk pregnancy, antepartum, third trimester IOL tomorrow   PRENATAL VISIT NOTE  Subjective:  Ariana [name] is a 32 y.o. [phone] at [redacted]w[redacted]d being seen today for ongoing prenatal care.  She is currently monitored for the following issues for this high-risk pregnancy and has Supervision of high risk pregnancy, antepartum, third trimester; Abnormal maternal serum  alpha-fetoprotein; and Gestational diabetes mellitus (GDM) in third trimester on their problem list.  Patient reports no complaints.  Contractions: Not present. Vag. Bleeding: None.  Movement: Present. Denies leaking of fluid.   The following portions of the patient's history were reviewed and updated as appropriate: allergies, current medications, past family history, past medical history, past social history, past surgical history and problem list. Problem list updated.  Objective:   Vitals:   10/12/17 0946  BP: (!) 139/92  Pulse: (!) 102  Weight: 208 lb 12.8 oz (94.7 kg)    Fetal Status: Fetal Heart Rate (bpm): NST   Movement: Present     General:  Alert, oriented and cooperative. Patient is in no acute distress.  Skin: Skin is warm and dry. No rash noted.   Cardiovascular: Normal heart rate noted  Respiratory: Normal respiratory effort, no problems with respiration noted  Abdomen: Soft, gravid, appropriate for gestational age.  Pain/Pressure: Present     Pelvic: Cervical exam performed        Extremities: Normal range of motion.  Edema: Trace  Mental Status:  Normal mood and affect. Normal behavior. Normal judgment and thought content.  Procedure: Patient informed of R/B/A of procedure. NST was performed and was reactive prior to procedure. Procedure done to begin ripening of the cervix prior to admission for induction of labor. Appropriate time out taken. The patient was placed in the lithotomy position and a cervical exam was performed and a finger was used to guide the 42F foley balloon through the internal os of the cervix. Foley Balloon filled with 60cc of normal saline. Plug inserted into end of  the foley. Foley placed on tension and taped to medical thigh. NST:  Baseline: 140 bpm, Variability: Good {> 6 bpm), Accelerations: Reactive and Decelerations: Absent there were no signs of tachysystole or hypertonus. All equipment was removed and accounted for. The patient tolerated the  procedure well.  Assessment and Plan:  Pregnancy: G4P3003 at [redacted]w[redacted]d 1. Supervision of high risk pregnancy, antepartum, third trimester IOL 39 weeks  2. Gestational diabetes mellitus (GDM) in third trimester controlled on oral hypoglycemic drug    S/p Outpatient placement of foley balloon catheter for cervical ripening. Induction of labor scheduled for tomorrow at 1200/0630/0700/0730/0800 am. Reassuring FHR tracing with no concerns at present. Warning signs given to patient to include return to MAU for heavy vaginal bleeding, Rupture of membranes, painful uterine contractions q 5 mins or less, severe abdominal discomfort, decreased fetal movement.  Return if symptoms worsen or fail to improve, for postpartum.   Scheryl Darter, MD 10/12/2017 11:07 AM   2. Gestational diabetes mellitus (GDM) in third trimester controlled on oral hypoglycemic drug   Term labor symptoms and general obstetric precautions including but not limited to vaginal bleeding, contractions, leaking of fluid and fetal movement were reviewed in detail with the patient. Please refer to After Visit Summary for other counseling recommendations.  Return if symptoms worsen or fail to improve, for postpartum.  Future Appointments  Date Time Provider Department Center  10/13/2017  7:30 AM [facility] None    Scheryl Darter, MD

## 2017-10-12 NOTE — Patient Instructions (Signed)
Labor Induction Labor induction is when steps are taken to cause a pregnant woman to begin the labor process. Most women go into labor on their own between 37 weeks and 42 weeks of the pregnancy. When this does not happen or when there is a medical need, methods may be used to induce labor. Labor induction causes a pregnant woman's uterus to contract. It also causes the cervix to soften (ripen), open (dilate), and thin out (efface). Usually, labor is not induced before 39 weeks of the pregnancy unless there is a problem with the baby or mother. Before inducing labor, your health care provider will consider a number of factors, including the following:  The medical condition of you and the baby.  How many weeks along you are.  The status of the baby's lung maturity.  The condition of the cervix.  The position of the baby. What are the reasons for labor induction? Labor may be induced for the following reasons:  The health of the baby or mother is at risk.  The pregnancy is overdue by 1 week or more.  The water breaks but labor does not start on its own.  The mother has a health condition or serious illness, such as high blood pressure, infection, placental abruption, or diabetes.  The amniotic fluid amounts are low around the baby.  The baby is distressed. Convenience or wanting the baby to be born on a certain date is not a reason for inducing labor. What methods are used for labor induction? Several methods of labor induction may be used, such as:  Prostaglandin medicine. This medicine causes the cervix to dilate and ripen. The medicine will also start contractions. It can be taken by mouth or by inserting a suppository into the vagina.  Inserting a thin tube (catheter) with a balloon on the end into the vagina to dilate the cervix. Once inserted, the balloon is expanded with water, which causes the cervix to open.  Stripping the membranes. Your health care provider separates  amniotic sac tissue from the cervix, causing the cervix to be stretched and causing the release of a hormone called progesterone. This may cause the uterus to contract. It is often done during an office visit. You will be sent home to wait for the contractions to begin. You will then come in for an induction.  Breaking the water. Your health care provider makes a hole in the amniotic sac using a small instrument. Once the amniotic sac breaks, contractions should begin. This may still take hours to see an effect.  Medicine to trigger or strengthen contractions. This medicine is given through an IV access tube inserted into a vein in your arm. All of the methods of induction, besides stripping the membranes, will be done in the hospital. Induction is done in the hospital so that you and the baby can be carefully monitored. How long does it take for labor to be induced? Some inductions can take up to 2-3 days. Depending on the cervix, it usually takes less time. It takes longer when you are induced early in the pregnancy or if this is your first pregnancy. If a mother is still pregnant and the induction has been going on for 2-3 days, either the mother will be sent home or a cesarean delivery will be needed. What are the risks associated with labor induction? Some of the risks of induction include:  Changes in fetal heart rate, such as too high, too low, or erratic.  Fetal distress.    Chance of infection for the mother and baby.  Increased chance of having a cesarean delivery.  Breaking off (abruption) of the placenta from the uterus (rare).  Uterine rupture (very rare). When induction is needed for medical reasons, the benefits of induction may outweigh the risks. What are some reasons for not inducing labor? Labor induction should not be done if:  It is shown that your baby does not tolerate labor.  You have had previous surgeries on your uterus, such as a myomectomy or the removal of  fibroids.  Your placenta lies very low in the uterus and blocks the opening of the cervix (placenta previa).  Your baby is not in a head-down position.  The umbilical cord drops down into the birth canal in front of the baby. This could cut off the baby's blood and oxygen supply.  You have had a previous cesarean delivery.  There are unusual circumstances, such as the baby being extremely premature. This information is not intended to replace advice given to you by your health care provider. Make sure you discuss any questions you have with your health care provider. Document Released: 10/21/2006 Document Revised: 11/07/2015 Document Reviewed: 12/29/2012 Elsevier Interactive Patient Education  2017 [employer]  

## 2017-10-13 ENCOUNTER — Encounter (HOSPITAL_COMMUNITY): Payer: Self-pay

## 2017-10-13 ENCOUNTER — Inpatient Hospital Stay (HOSPITAL_COMMUNITY)
Admission: RE | Admit: 2017-10-13 | Discharge: 2017-10-15 | DRG: 807 | Disposition: A | Discharge status: Home / Self Care | Payer: Medicaid Other | Source: Ambulatory Visit | Attending: Obstetrics & Gynecology | Admitting: Obstetrics & Gynecology

## 2017-10-13 VITALS — BP 111/81 | HR 87 | Temp 97.9°F | Resp 18 | Ht 63.0 in | Wt 203.5 lb

## 2017-10-13 DIAGNOSIS — Z3A39 39 weeks gestation of pregnancy: Secondary | ICD-10-CM

## 2017-10-13 DIAGNOSIS — R030 Elevated blood-pressure reading, without diagnosis of hypertension: Secondary | ICD-10-CM | POA: Diagnosis not present

## 2017-10-13 DIAGNOSIS — O0993 Supervision of high risk pregnancy, unspecified, third trimester: Secondary | ICD-10-CM

## 2017-10-13 DIAGNOSIS — O24425 Gestational diabetes mellitus in childbirth, controlled by oral hypoglycemic drugs: Principal | ICD-10-CM | POA: Diagnosis present

## 2017-10-13 DIAGNOSIS — O24419 Gestational diabetes mellitus in pregnancy, unspecified control: Secondary | ICD-10-CM | POA: Diagnosis present

## 2017-10-13 DIAGNOSIS — O280 Abnormal hematological finding on antenatal screening of mother: Secondary | ICD-10-CM | POA: Diagnosis present

## 2017-10-13 DIAGNOSIS — O24429 Gestational diabetes mellitus in childbirth, unspecified control: Secondary | ICD-10-CM | POA: Diagnosis not present

## 2017-10-13 LAB — TYPE AND SCREEN
ABO/RH(D): O POS
ANTIBODY SCREEN: NEGATIVE

## 2017-10-13 LAB — CBC
HCT: 34 % — ABNORMAL LOW (ref 36.0–46.0)
HEMATOCRIT: 35.8 % — AB (ref 36.0–46.0)
Hemoglobin: 11.6 g/dL — ABNORMAL LOW (ref 12.0–15.0)
Hemoglobin: 11 g/dL — ABNORMAL LOW (ref 12.0–15.0)
MCH: 29.4 pg (ref 26.0–34.0)
MCH: 29.4 pg (ref 26.0–34.0)
MCHC: 32.4 g/dL (ref 30.0–36.0)
MCHC: 32.4 g/dL (ref 30.0–36.0)
MCV: 90.6 fL (ref 78.0–100.0)
MCV: 90.9 fL (ref 78.0–100.0)
PLATELETS: 225 10*3/uL (ref 150–400)
PLATELETS: 228 10*3/uL (ref 150–400)
RBC: 3.95 MIL/uL (ref 3.87–5.11)
RBC: 3.74 MIL/uL — ABNORMAL LOW (ref 3.87–5.11)
RDW: 14.5 % (ref 11.5–15.5)
RDW: 14.3 % (ref 11.5–15.5)
WBC: 7.6 10*3/uL (ref 4.0–10.5)
WBC: 14.7 10*3/uL — ABNORMAL HIGH (ref 4.0–10.5)

## 2017-10-13 LAB — ABO/RH: ABO/RH(D): O POS

## 2017-10-13 LAB — COMPREHENSIVE METABOLIC PANEL
ALK PHOS: 99 U/L (ref 38–126)
ALT: 15 U/L (ref 14–54)
ANION GAP: 11 (ref 5–15)
AST: 19 U/L (ref 15–41)
Albumin: 3.1 g/dL — ABNORMAL LOW (ref 3.5–5.0)
BUN: 9 mg/dL (ref 6–20)
CALCIUM: 9 mg/dL (ref 8.9–10.3)
CHLORIDE: 105 mmol/L (ref 101–111)
CO2: 18 mmol/L — ABNORMAL LOW (ref 22–32)
CREATININE: 0.48 mg/dL (ref 0.44–1.00)
Glucose, Bld: 135 mg/dL — ABNORMAL HIGH (ref 65–99)
Potassium: 3.6 mmol/L (ref 3.5–5.1)
Sodium: 134 mmol/L — ABNORMAL LOW (ref 135–145)
Total Bilirubin: 1.7 mg/dL — ABNORMAL HIGH (ref 0.3–1.2)
Total Protein: 5.7 g/dL — ABNORMAL LOW (ref 6.5–8.1)

## 2017-10-13 LAB — PROTEIN / CREATININE RATIO, URINE
CREATININE, URINE: 117 mg/dL
PROTEIN CREATININE RATIO: 0.22 mg/mg{creat} — AB (ref 0.00–0.15)
TOTAL PROTEIN, URINE: 26 mg/dL

## 2017-10-13 LAB — GLUCOSE, CAPILLARY
GLUCOSE-CAPILLARY: 92 mg/dL (ref 65–99)
Glucose-Capillary: 74 mg/dL (ref 65–99)
Glucose-Capillary: 80 mg/dL (ref 65–99)
Glucose-Capillary: 68 mg/dL (ref 65–99)

## 2017-10-13 MED ORDER — OXYTOCIN 40 UNITS IN LACTATED RINGERS INFUSION - SIMPLE MED
2.5000 [IU]/h | INTRAVENOUS | Status: DC
  Filled 2017-10-13: qty 1000

## 2017-10-13 MED ORDER — SIMETHICONE 80 MG PO CHEW: 80.0000 mg | CHEWABLE_TABLET | ORAL | Status: DC | PRN

## 2017-10-13 MED ORDER — TERBUTALINE SULFATE 1 MG/ML IJ SOLN
0.2500 mg | Freq: Once | INTRAMUSCULAR | Status: DC | PRN
  Filled 2017-10-13: qty 1

## 2017-10-13 MED ORDER — OXYCODONE HCL 5 MG PO TABS
5.0000 mg | ORAL_TABLET | ORAL | Status: DC | PRN
  Administered 2017-10-15: 5 mg via ORAL
  Filled 2017-10-13: qty 1

## 2017-10-13 MED ORDER — COCONUT OIL OIL: 1.0000 "application " | TOPICAL_OIL | Status: DC | PRN

## 2017-10-13 MED ORDER — LACTATED RINGERS IV SOLN: 500.0000 mL | INTRAVENOUS | Status: DC | PRN

## 2017-10-13 MED ORDER — OXYTOCIN 40 UNITS IN LACTATED RINGERS INFUSION - SIMPLE MED
1.0000 m[IU]/min | INTRAVENOUS | Status: DC
  Administered 2017-10-13: 2 m[IU]/min via INTRAVENOUS

## 2017-10-13 MED ORDER — IBUPROFEN 600 MG PO TABS
600.0000 mg | ORAL_TABLET | Freq: Four times a day (QID) | ORAL | Status: DC
  Administered 2017-10-13 – 2017-10-15 (×8): 600 mg via ORAL
  Filled 2017-10-13 (×8): qty 1

## 2017-10-13 MED ORDER — OXYTOCIN BOLUS FROM INFUSION
500.0000 mL | Freq: Once | INTRAVENOUS | Status: AC
  Administered 2017-10-13: 500 mL via INTRAVENOUS

## 2017-10-13 MED ORDER — ONDANSETRON HCL 4 MG PO TABS: 4.0000 mg | ORAL_TABLET | ORAL | Status: DC | PRN

## 2017-10-13 MED ORDER — DIPHENHYDRAMINE HCL 25 MG PO CAPS
25.0000 mg | ORAL_CAPSULE | Freq: Four times a day (QID) | ORAL | Status: DC | PRN
Start: 2017-10-13 — End: 2017-10-15

## 2017-10-13 MED ORDER — DIBUCAINE 1 % RE OINT: 1.0000 "application " | TOPICAL_OINTMENT | RECTAL | Status: DC | PRN

## 2017-10-13 MED ORDER — LACTATED RINGERS IV SOLN
INTRAVENOUS | Status: DC
  Administered 2017-10-13 (×2): via INTRAVENOUS

## 2017-10-13 MED ORDER — ZOLPIDEM TARTRATE 5 MG PO TABS: 5.0000 mg | ORAL_TABLET | Freq: Every evening | ORAL | Status: DC | PRN

## 2017-10-13 MED ORDER — WITCH HAZEL-GLYCERIN EX PADS: 1.0000 "application " | MEDICATED_PAD | CUTANEOUS | Status: DC | PRN

## 2017-10-13 MED ORDER — ONDANSETRON HCL 4 MG/2ML IJ SOLN: 4.0000 mg | Freq: Four times a day (QID) | INTRAMUSCULAR | Status: DC | PRN

## 2017-10-13 MED ORDER — PRENATAL MULTIVITAMIN CH
1.0000 | ORAL_TABLET | Freq: Every day | ORAL | Status: DC
  Administered 2017-10-14 – 2017-10-15 (×2): 1 via ORAL
  Filled 2017-10-13 (×2): qty 1

## 2017-10-13 MED ORDER — MISOPROSTOL 25 MCG QUARTER TABLET
25.0000 ug | ORAL_TABLET | ORAL | Status: DC | PRN
  Filled 2017-10-13: qty 1

## 2017-10-13 MED ORDER — SENNOSIDES-DOCUSATE SODIUM 8.6-50 MG PO TABS
2.0000 | ORAL_TABLET | ORAL | Status: DC
  Administered 2017-10-13 – 2017-10-15 (×2): 2 via ORAL
  Filled 2017-10-13 (×2): qty 2

## 2017-10-13 MED ORDER — SOD CITRATE-CITRIC ACID 500-334 MG/5ML PO SOLN: 30.0000 mL | ORAL | Status: DC | PRN

## 2017-10-13 MED ORDER — LIDOCAINE HCL (PF) 1 % IJ SOLN
30.0000 mL | INTRAMUSCULAR | Status: DC | PRN
  Filled 2017-10-13: qty 30

## 2017-10-13 MED ORDER — TETANUS-DIPHTH-ACELL PERTUSSIS 5-2.5-18.5 LF-MCG/0.5 IM SUSP: 0.5000 mL | Freq: Once | INTRAMUSCULAR | Status: DC

## 2017-10-13 MED ORDER — ACETAMINOPHEN 325 MG PO TABS
650.0000 mg | ORAL_TABLET | ORAL | Status: DC | PRN
  Administered 2017-10-14: 650 mg via ORAL
  Filled 2017-10-13: qty 2

## 2017-10-13 MED ORDER — BENZOCAINE-MENTHOL 20-0.5 % EX AERO: 1.0000 "application " | INHALATION_SPRAY | CUTANEOUS | Status: DC | PRN

## 2017-10-13 MED ORDER — ONDANSETRON HCL 4 MG/2ML IJ SOLN: 4.0000 mg | INTRAMUSCULAR | Status: DC | PRN

## 2017-10-13 MED ORDER — ACETAMINOPHEN 325 MG PO TABS: 650.0000 mg | ORAL_TABLET | ORAL | Status: DC | PRN

## 2017-10-13 MED ORDER — OXYCODONE HCL 5 MG PO TABS: 10.0000 mg | ORAL_TABLET | ORAL | Status: DC | PRN

## 2017-10-13 NOTE — Anesthesia Pain Management Evaluation Note (Signed)
  [facility] Pain Management Visit Note  Patient: Ariana [name], 32 y.o., female  "Hello I am a member of the anesthesia team at [facility]. We have an anesthesia team available at all times to provide care throughout the hospital, including epidural management and anesthesia for C-section. I don't know your plan for the delivery whether it a natural birth, water birth, IV sedation, nitrous supplementation, doula or epidural, but we want to meet your pain goals."   1.Was your pain managed to your expectations on prior hospitalizations?   No prior hospitalizations  2.What is your expectation for pain management during this hospitalization?     Labor support without medications  3.How can we help you reach that goal?   Record the patient's initial score and the patient's pain goal.   Pain: 6  Pain Goal: 10 [facility] wants you to be able to say your pain was always managed very well.  Laban Emperor 10/13/2017

## 2017-10-13 NOTE — H&P (Signed)
Ariana [name] is a 32 y.o. female [phone]  pt of [facility] presenting for IOL for GDM.  Foley bulb was placed yesterday in the office by Dr Debroah Loop and came out this morning upon arrival in MAU.  She reports good fetal movement, denies regular contractions, LOF, vaginal bleeding, vaginal itching/burning, urinary symptoms, h/a, dizziness, n/v, or fever/chills.     [facility] Prenatal Labs  Dating  LMP Blood type: O/Positive/-- (10/24 1532)   Genetic Screen  Quad:  Increased DSR, declined NIPS, amnio Antibody:Negative (10/24 1532)  Anatomic [place] Normal anatomy except limited spine, rescan ordered 2/19 Rubella: 21.40 (10/24 1532)  GTT  Third trimester: GDM  RPR: Non Reactive (02/07 1007)   Flu vaccine 04/07/17 HBsAg: Negative (10/24 1532)   TDaP vaccine 07/22/17                                              HIV: Non Reactive (02/07 1007) negative  Baby Food  Breast                                            [id number] (04/10 1701)(For PCN allergy, check sensitivities)  Contraception Depo [id number]  Circumcision Girl!   Pediatrician Unsure  CF: Neg  Support Person FOB SMA: Neg  Prenatal Classes No  Hgb electrophoresis: AA    OB History    Gravida  4   Para  3   Term  3   Preterm      AB      Living  3     SAB      TAB      Ectopic      Multiple      Live Births  3          History reviewed. No pertinent past medical history. History reviewed. No pertinent surgical history. Family History: family history is not on file. Social History:  reports that she has never smoked. She has never used smokeless tobacco. She reports that she does not drink alcohol or use drugs.     Maternal Diabetes: Yes:  Diabetes Type:  Insulin/Medication controlled Genetic Screening: Abnormal:  Results: Elevated risk of Trisomy 21 Maternal Ultrasounds/Referrals: Normal Fetal Ultrasounds or other Referrals:  None Maternal Substance Abuse:  No Significant Maternal Medications:  Meds  include: Other:  Significant Maternal Lab Results:  Lab values include: Group B Strep negative Other Comments:  Metformin  Review of Systems  Constitutional: Negative for chills, fever and malaise/fatigue.  Eyes: Negative for blurred vision.  Respiratory: Negative for cough and shortness of breath.   Cardiovascular: Negative for chest pain.  Gastrointestinal: Negative for abdominal pain, heartburn and vomiting.  Genitourinary: Negative for dysuria, frequency and urgency.  Musculoskeletal: Negative.   Neurological: Negative for dizziness and headaches.  Psychiatric/Behavioral: Negative for depression.   Maternal Medical History:  Reason for admission: IOL for GDM on meds   Contractions: Frequency: irregular.   Perceived severity is mild.    Fetal activity: Last perceived fetal movement was within the past hour.    Prenatal complications: no prenatal complications Prenatal Complications - Diabetes: gestational. Diabetes is managed by oral agent (monotherapy).      Dilation: 4 Effacement (%): 70 Station: -2, -3 Exam by:: stone rnc Blood  pressure (!) 126/91, pulse (!) 116, temperature 98.4 F (36.9 C), temperature source Oral, resp. rate 18, height  (1.6 m), weight 203 lb 8 oz (92.3 kg), last menstrual period 01/13/2017. Maternal Exam:  Uterine Assessment: Contraction strength is mild.  Contraction frequency is irregular.   Abdomen: Fetal presentation: vertex  Cervix: Cervix evaluated by digital exam.     Fetal Exam Fetal Monitor Review: Mode: ultrasound.   Baseline rate: 125.  Variability: moderate (6-25 bpm).   Pattern: accelerations present and no decelerations.    Fetal State Assessment: Category I - tracings are normal.     Physical Exam  Nursing note and vitals reviewed. Constitutional: She is oriented to person, place, and time. She appears well-developed and well-nourished.  Neck: Normal range of motion.  Cardiovascular: Normal rate, regular rhythm  and normal heart sounds.  Respiratory: Effort normal and breath sounds normal.  GI: Soft.  Musculoskeletal: Normal range of motion.  Neurological: She is alert and oriented to person, place, and time.  Skin: Skin is warm and dry.  Psychiatric: She has a normal mood and affect. Her behavior is normal. Judgment and thought content normal.    Prenatal labs: ABO, Rh: O/Positive/-- (10/24 1532) Antibody: Negative (10/24 1532) Rubella: 21.40 (10/24 1532) RPR: Non Reactive (02/07 1007)  HBsAg: Negative (10/24 1532)  HIV: Non Reactive (02/07 1007)  GBS: Negative (04/10 1701)   Assessment/Plan: [id number] @ [redacted]w[redacted]d IOL for GDM on meds GBS neg  Admit to BS FB out so start Pitocin Anticipate NSVD   Sharen Counter 10/13/2017, 9:30 AM

## 2017-10-13 NOTE — Progress Notes (Signed)
Pt primary language Ariana [name] but does speak english well. Husband also at bedside and speaks and seems to understanding english well also. Hospital interpreter offered multiple times with refusal by patient and husband. Pt requests for husband to interpret if needed. Form signed. Informed that they can change their mind at any time and we can get interpreter for them.

## 2017-10-14 LAB — GLUCOSE, CAPILLARY: GLUCOSE-CAPILLARY: 88 mg/dL (ref 65–99)

## 2017-10-14 LAB — RPR: RPR Ser Ql: NONREACTIVE

## 2017-10-14 NOTE — Progress Notes (Signed)
POSTPARTUM PROGRESS NOTE  Post Partum Day 1  Subjective:  Ariana [name] is a 32 y.o. [id number] s/p SVD at [redacted]w[redacted]d.  She reports she is doing well. No acute events overnight. She denies any problems with ambulating, voiding or po intake. Denies nausea or vomiting.  Pain is well controlled.  Lochia is small.  Objective: Blood pressure 118/79, pulse 87, temperature 98.9 F (37.2 C), temperature source Oral, resp. rate 18, height  (1.6 m), weight 203 lb 8 oz (92.3 kg), last menstrual period 01/13/2017, unknown if currently breastfeeding.  Physical Exam:  General: alert, cooperative and no distress Chest: no respiratory distress Heart:regular rate, distal pulses intact Abdomen: soft, nontender,  Uterine Fundus: firm, appropriately tender DVT Evaluation: No calf swelling or tenderness Extremities: no edema Skin: warm, dry  CBG (last 3)  Recent Labs    10/13/17 1324 10/13/17 1528 10/14/17 0711  GLUCAP 80 68 88    Assessment/Plan: Ariana [name] is a 32 y.o. [id number] s/p SVD at [redacted]w[redacted]d   PPD#1 - Doing well  Routine postpartum care A2GDM: postpartum fasting CBG 80. Contraception: Depo Feeding: breast Dispo: Plan for discharge tomorrow.   LOS: 1 day   Kandra Nicolas DegeleMD 10/14/2017, 7:24 AM

## 2017-10-14 NOTE — Lactation Note (Signed)
This note was copied from a baby's chart. Lactation Consultation Note  Patient Name: Ariana [name] ZOXWR'[id number] Date: 10/14/2017 Reason for consult: Initial assessment;Term  G4P4 mother whose infant is now 27 hours old.  Mother breastfed her other 3 children for 1 1/2-2 years.   Upon assessment, mother's breasts are large with large everted nipples.  Reviewed breast massage and hand expression with mother.  Few drops of colostrum were noted.  Mother desired to sit straight up in bed with no pillow or arm support.  Assisted mother to latch infant in the cross cradle hold on the left breast.  Infant made no effort to latch or suck.  According to the chart, baby has not fed since 0100.  Encouraged mother to do STS and to watch for feeding cues.  Reviewed feeding cues.  Mother will feed infant 8-12 times/24 hours or earlier if baby shows cues. Continue breast massage and hand expression to increase milk supply.  Swaddled infant and placed on back in bassinet. Mother has no further questions/concerns at this time.  Mom made aware of O/P services, breastfeeding support groups, community resources, and our phone # for post-discharge questions.  Maternal Data Formula Feeding for Exclusion: No Has patient been taught Hand Expression?: Yes Does the patient have breastfeeding experience prior to this delivery?: Yes  Feeding Feeding Type: Breast Fed Length of feed: 0 min  LATCH Score Latch: Too sleepy or reluctant, no latch achieved, no sucking elicited.  Audible Swallowing: None  Type of Nipple: Everted at rest and after stimulation  Comfort (Breast/Nipple): Soft / non-tender  Hold (Positioning): Assistance needed to correctly position infant at breast and maintain latch.  LATCH Score: 5  Interventions Interventions: Breast feeding basics reviewed;Assisted with latch;Skin to skin;Breast massage;Hand express;Support pillows;Adjust position;Breast compression  Lactation Tools Discussed/Used      Consult Status Consult Status: Follow-up Date: 10/15/17 Follow-up type: In-patient    Ariana [name] R [name] 10/14/2017, 5:20 AM

## 2017-10-14 NOTE — Lactation Note (Signed)
This note was copied from a baby's chart. Lactation Consultation Note  Patient Name: Ariana [name] [id number] Date: 10/14/2017    [facility] Initial Visit:  Mother sleeping with baby in bassinet asleep at bedside; will return later.             Keath Matera R Tahani Potier 10/14/2017, 3:52 AM

## 2017-10-15 ENCOUNTER — Encounter: Payer: Self-pay | Admitting: *Deleted

## 2017-10-15 DIAGNOSIS — R030 Elevated blood-pressure reading, without diagnosis of hypertension: Secondary | ICD-10-CM | POA: Diagnosis not present

## 2017-10-15 MED ORDER — PRENATAL MULTIVITAMIN CH: 1.0000 | ORAL_TABLET | Freq: Every day | ORAL | 3 refills | Status: DC

## 2017-10-15 MED ORDER — IBUPROFEN 600 MG PO TABS: 600.0000 mg | ORAL_TABLET | Freq: Four times a day (QID) | ORAL | 0 refills | Status: DC | PRN

## 2017-10-15 NOTE — Progress Notes (Signed)
CSW received consult due to score 18 on Edinburgh Depression Screen.    When CSW arrived, MOB was bonding with infant on the couch as evidence by engaging in breastfeeding.  FOB and MOB's toddler son was present and they were observing MOB's and infant's interaction. CSW explained CSW's role and MOB gave CSW permission to complete the assessment while MOB's guest were present. CSW offered interpreting services and MOB declined.  FOB communicated that MOB and FOB speaks and understands [place] fluently. MOB's affect and mood were WNL and MOB was receptive to meeting with CSW.   CSW reviewed MOB's Edinburgh score and after CSW explanations of questions, MOB and FOB reported that they did not understand what the questions were asking. MOB score was lower than 18 (9) once CSW adminstered questions verbally.   CSW provided education regarding Baby Blues vs PMADs. CSW encouraged MOB to evaluate her mental health throughout the postpartum period with the use of the New Mom Checklist developed by Postpartum Progress and notify a medical professional if symptoms arise.  MOB denied PPD with MOB's older children and expressed feeling comfortable seeking help if help is warranted.   MOB reported having all essentials for infant and feeling prepared to parent. FOB was interested in additional resources for family since they are new to the [place].  A referral was made to the [facility] program.   There are no barriers to d/c.  Blaine Hamper, MSW, LCSW Clinical Social Work [phone]

## 2017-10-15 NOTE — Lactation Note (Signed)
This note was copied from a baby's chart. Lactation Consultation Note; Experienced BF mom. Mom reports that baby has been feeding for 30 min when I went into room. Reports pain with latch. Offered assist and mom agreeable. Mom not using any pillows for support- leaning over to baby in her lap. Reviewed wide open mouth and keeping the baby close to the breast throughout the feeding. Used pillows and assisted with deeper latch. Mom reports that feels better. Asking about giving bottles of formula- encouraged to always breast feed first to promote a good milk supply. No further questions at present.   Patient Name: Ariana [name] ZOXWR'U Date: 10/15/2017 Reason for consult: Follow-up assessment   Maternal Data Formula Feeding for Exclusion: No Does the patient have breastfeeding experience prior to this delivery?: Yes  Feeding Feeding Type: Breast Fed  LATCH Score Latch: Grasps breast easily, tongue down, lips flanged, rhythmical sucking.  Audible Swallowing: A few with stimulation  Type of Nipple: Everted at rest and after stimulation  Comfort (Breast/Nipple): Filling, red/small blisters or bruises, mild/mod discomfort  Hold (Positioning): Assistance needed to correctly position infant at breast and maintain latch.  LATCH Score: 7  Interventions    Lactation Tools Discussed/Used     Consult Status Consult Status: Complete    Pamelia Hoit 10/15/2017, 10:05 AM

## 2017-10-15 NOTE — Discharge Summary (Signed)
OB Discharge Summary     Patient Name: Ariana [name] DOB: 13-Aug-1985 MRN: [id number]  Date of admission: 10/13/2017 Delivering MD: Fatima Blank A   Date of discharge: 10/15/2017  Admitting diagnosis: INDUCTION Intrauterine pregnancy: [redacted]w[redacted]d    Secondary diagnosis:  Principal Problem:   Supervision of high risk pregnancy, antepartum, third trimester Active Problems:   Abnormal maternal serum alpha-fetoprotein   Gestational diabetes mellitus (GDM) in third trimester   Gestational diabetes mellitus (GDM) affecting fourth pregnancy   NSVD (normal spontaneous vaginal delivery)   Borderline blood pressure  Additional problems: GBS neg     Discharge diagnosis: Term Pregnancy Delivered, GDM A2 and borderline BPs immediate postpartum                                                                                                Post partum procedures:pre-e labs drawn: normal  Augmentation: AROM, Pitocin and Foley Balloon  Complications: None  Hospital course:  Induction of Labor With Vaginal Delivery   32y.o. yo [id number] admitted to the hospital 10/13/2017 for induction of labor. A cervical foley had been placed the day prior and came out upon arrival to MAU for IOL. Indication for induction: A2 DM.  Patient had an uncomplicated labor course as follows: Pit was started upon arrival to [facility] progressed to SVD. Membrane Rupture Time/Date: 12:36 PM ,10/13/2017   Intrapartum Procedures: Episiotomy: None [1]                                         Lacerations:  None [1]  Patient had delivery of a Viable infant.  Information for the patient's newborn:  HeEdeline, Greeningirl Ro[place]endora0[[id number]]Delivery Method: Vag-Spont   10/13/2017  Details of delivery can be found in separate delivery note.  Patient had a routine postpartum course other than some borderline elevated BPs initially in the PP period; nl labs. Patient is discharged home 10/15/17. She will have a Baby Love  BP check in 1 week.  Physical exam  Vitals:   10/14/17 0155 10/14/17 0933 10/14/17 1827 10/15/17 0605  BP: 118/79 (!) 129/96 122/85 111/81  Pulse: 87 89 92 87  Resp: _0 Temp: 98.9 F (37.2 C) 98.4 F (36.9 C) 97.8 F (36.6 C) 97.9 F (36.6 C)  TempSrc: Oral Oral Oral Oral  SpO2:    100%  Weight:      Height:       General: alert and cooperative Lochia: appropriate Uterine Fundus: firm Incision: N/A DVT Evaluation: No evidence of DVT seen on physical exam. Labs: Lab Results  Component Value Date   WBC 14.7 (H) 10/13/2017   HGB 11.0 (L) 10/13/2017   HCT 34.0 (L) 10/13/2017   MCV 90.9 10/13/2017   PLT 228 10/13/2017   CMP Latest Ref Rng & Units 10/13/2017  Glucose 65 - 99 mg/dL 135(H)  BUN 6 - 20 mg/dL 9  Creatinine 0.44 - 1.00 mg/dL 0.48  Sodium 135 -  145 mmol/L 134(L)  Potassium 3.5 - 5.1 mmol/L 3.6  Chloride 101 - 111 mmol/L 105  CO2 22 - 32 mmol/L 18(L)  Calcium 8.9 - 10.3 mg/dL 9.0  Total Protein 6.5 - 8.1 g/dL 5.7(L)  Total Bilirubin 0.3 - 1.2 mg/dL 1.7(H)  Alkaline Phos 38 - 126 U/L 99  AST 15 - 41 U/L 19  ALT 14 - 54 U/L 15    Discharge instruction: per After Visit Summary and "Baby and Me Booklet".  After visit meds:  Allergies as of 10/15/2017   No Known Allergies     Medication List    STOP taking these medications   ACCU-CHEK FASTCLIX LANCETS Misc   ACCU-CHEK GUIDE w/Device Kit   docusate sodium 100 MG capsule Commonly known as:  COLACE   famotidine 20 MG tablet Commonly known as:  PEPCID   glucose blood test strip Commonly known as:  ACCU-CHEK GUIDE   glycopyrrolate 1 MG tablet Commonly known as:  ROBINUL   metFORMIN 500 MG tablet Commonly known as:  GLUCOPHAGE   ondansetron 4 MG disintegrating tablet Commonly known as:  ZOFRAN ODT   PRENATAL COMPLETE 14-0.4 MG Tabs   promethazine 25 MG tablet Commonly known as:  PHENERGAN     TAKE these medications   ibuprofen 600 MG tablet Commonly known as:  ADVIL,MOTRIN Take  1 tablet (600 mg total) by mouth every 6 (six) hours as needed.   prenatal multivitamin Tabs tablet Take 1 tablet by mouth daily at 12 noon.       Diet: routine diet  Activity: Advance as tolerated. Pelvic rest for 6 weeks.   Outpatient follow up:Baby Love BP check in 1 week; PP visit in 6 wks with GTT Follow up Appt:No future appointments. Follow up Visit:No follow-ups on file.  Postpartum contraception: Depo Provera  Newborn Data: Live born female  Birth Weight: 7 lb 6.3 oz (3355 g) APGAR: 8, 9  Newborn Delivery   Birth date/time:  10/13/2017 16:44:00 Delivery type:  Vaginal, Spontaneous     Baby Feeding: Breast Disposition:home with mother   10/15/2017 Serita Grammes, CNM 10:07 AM

## 2017-10-15 NOTE — Discharge Instructions (Signed)
Vaginal Delivery, Care After °Refer to this sheet in the next few weeks. These instructions provide you with information about caring for yourself after vaginal delivery. Your health care provider may also give you more specific instructions. Your treatment has been planned according to current medical practices, but problems sometimes occur. Call your health care provider if you have any problems or questions. °What can I expect after the procedure? °After vaginal delivery, it is common to have: °· Some bleeding from your vagina. °· Soreness in your abdomen, your vagina, and the area of skin between your vaginal opening and your anus (perineum). °· Pelvic cramps. °· Fatigue. ° °Follow these instructions at home: °Medicines °· Take over-the-counter and prescription medicines only as told by your health care provider. °· If you were prescribed an antibiotic medicine, take it as told by your health care provider. Do not stop taking the antibiotic until it is finished. °Driving ° °· Do not drive or operate heavy machinery while taking prescription pain medicine. °· Do not drive for 24 hours if you received a sedative. °Lifestyle °· Do not drink alcohol. This is especially important if you are breastfeeding or taking medicine to relieve pain. °· Do not use tobacco products, including cigarettes, chewing tobacco, or e-cigarettes. If you need help quitting, ask your health care provider. °Eating and drinking °· Drink at least 8 eight-ounce glasses of water every day unless you are told not to by your health care provider. If you choose to breastfeed your baby, you may need to drink more water than this. °· Eat high-fiber foods every day. These foods may help prevent or relieve constipation. High-fiber foods include: °? Whole grain cereals and breads. °? Brown rice. °? Beans. °? Fresh fruits and vegetables. °Activity °· Return to your normal activities as told by your health care provider. Ask your health care provider  what activities are safe for you. °· Rest as much as possible. Try to rest or take a nap when your baby is sleeping. °· Do not lift anything that is heavier than your baby or 10 lb (4.5 kg) until your health care provider says that it is safe. °· Talk with your health care provider about when you can engage in sexual activity. This may depend on your: °? Risk of infection. °? Rate of healing. °? Comfort and desire to engage in sexual activity. °Vaginal Care °· If you have an episiotomy or a vaginal tear, check the area every day for signs of infection. Check for: °? More redness, swelling, or pain. °? More fluid or blood. °? Warmth. °? Pus or a bad smell. °· Do not use tampons or douches until your health care provider says this is safe. °· Watch for any blood clots that may pass from your vagina. These may look like clumps of dark red, brown, or black discharge. °General instructions °· Keep your perineum clean and dry as told by your health care provider. °· Wear loose, comfortable clothing. °· Wipe from front to back when you use the toilet. °· Ask your health care provider if you can shower or take a bath. If you had an episiotomy or a perineal tear during labor and delivery, your health care provider may tell you not to take baths for a certain length of time. °· Wear a bra that supports your breasts and fits you well. °· If possible, have someone help you with household activities and help care for your baby for at least a few days after   you leave the hospital. °· Keep all follow-up visits for you and your baby as told by your health care provider. This is important. °Contact a health care provider if: °· You have: °? Vaginal discharge that has a bad smell. °? Difficulty urinating. °? Pain when urinating. °? A sudden increase or decrease in the frequency of your bowel movements. °? More redness, swelling, or pain around your episiotomy or vaginal tear. °? More fluid or blood coming from your episiotomy or  vaginal tear. °? Pus or a bad smell coming from your episiotomy or vaginal tear. °? A fever. °? A rash. °? Little or no interest in activities you used to enjoy. °? Questions about caring for yourself or your baby. °· Your episiotomy or vaginal tear feels warm to the touch. °· Your episiotomy or vaginal tear is separating or does not appear to be healing. °· Your breasts are painful, hard, or turn red. °· You feel unusually sad or worried. °· You feel nauseous or you vomit. °· You pass large blood clots from your vagina. If you pass a blood clot from your vagina, save it to show to your health care provider. Do not flush blood clots down the toilet without having your health care provider look at them. °· You urinate more than usual. °· You are dizzy or light-headed. °· You have not breastfed at all and you have not had a menstrual period for 12 weeks after delivery. °· You have stopped breastfeeding and you have not had a menstrual period for 12 weeks after you stopped breastfeeding. °Get help right away if: °· You have: °? Pain that does not go away or does not get better with medicine. °? Chest pain. °? Difficulty breathing. °? Blurred vision or spots in your vision. °? Thoughts about hurting yourself or your baby. °· You develop pain in your abdomen or in one of your legs. °· You develop a severe headache. °· You faint. °· You bleed from your vagina so much that you fill two sanitary pads in one hour. °This information is not intended to replace advice given to you by your health care provider. Make sure you discuss any questions you have with your health care provider. °Document Released: 05/29/2000 Document Revised: 11/13/2015 Document Reviewed: 06/16/2015 °Elsevier Interactive Patient Education © 2018 Elsevier Inc. ° °

## 2017-11-10 ENCOUNTER — Encounter: Payer: Self-pay | Admitting: Obstetrics and Gynecology

## 2017-11-10 ENCOUNTER — Other Ambulatory Visit: Payer: Self-pay

## 2017-11-10 ENCOUNTER — Ambulatory Visit (INDEPENDENT_AMBULATORY_CARE_PROVIDER_SITE_OTHER): Payer: Medicaid Other | Admitting: Obstetrics and Gynecology

## 2017-11-10 DIAGNOSIS — Z1389 Encounter for screening for other disorder: Secondary | ICD-10-CM | POA: Diagnosis not present

## 2017-11-10 DIAGNOSIS — Z3042 Encounter for surveillance of injectable contraceptive: Secondary | ICD-10-CM | POA: Diagnosis not present

## 2017-11-10 DIAGNOSIS — Z3202 Encounter for pregnancy test, result negative: Secondary | ICD-10-CM

## 2017-11-10 DIAGNOSIS — O280 Abnormal hematological finding on antenatal screening of mother: Secondary | ICD-10-CM

## 2017-11-10 DIAGNOSIS — O24419 Gestational diabetes mellitus in pregnancy, unspecified control: Secondary | ICD-10-CM

## 2017-11-10 DIAGNOSIS — IMO0001 Reserved for inherently not codable concepts without codable children: Secondary | ICD-10-CM

## 2017-11-10 LAB — POCT URINE PREGNANCY: PREG TEST UR: NEGATIVE

## 2017-11-10 MED ORDER — MEDROXYPROGESTERONE ACETATE 150 MG/ML IM SUSP
150.0000 mg | Freq: Once | INTRAMUSCULAR | Status: AC
  Administered 2017-11-10: 150 mg via INTRAMUSCULAR

## 2017-11-10 NOTE — Progress Notes (Signed)
    Obstetrics/Postpartum Visit  Appointment Date: 11/10/2017  OBGYN Clinic: [facility]  Primary Care Provider: Patient, No Pcp Per  Chief Complaint:  Chief Complaint  Patient presents with  . Postpartum Care    History of Present Illness: Mercede Rollo is a 32 y.o. African-American [id number] (No LMP recorded.), seen for the above chief complaint. Her past medical history is significant for n/a.  She is s/p SVD on 10/13/17 at 39 weeks; she was discharged to home on PPD#2. Pregnancy complicated by elevated AFP, gestational DMA2.  No complaints.   Vaginal bleeding or discharge: No  Breast or formula feeding: breast Intercourse: No  Contraception: requests depo PP depression s/s: No  Any bowel or bladder issues: No  Pap smear: no abnormalities (date: 03/2017)  Review of Systems: Positive for n/a.   Her 12 point review of systems is negative or as noted in the History of Present Illness.  Patient Active Problem List   Diagnosis Date Noted  . Borderline blood pressure 10/15/2017  . Gestational diabetes mellitus (GDM) affecting fourth pregnancy 10/13/2017  . NSVD (normal spontaneous vaginal delivery) 10/13/2017  . Gestational diabetes mellitus (GDM) in third trimester 07/26/2017  . Abnormal maternal serum alpha-fetoprotein 05/25/2017  . Supervision of high risk pregnancy, antepartum, third trimester 04/07/2017    Medications We administered medroxyPROGESTERone. Current Outpatient Medications  Medication Sig Dispense Refill  . ibuprofen (ADVIL,MOTRIN) 600 MG tablet Take 1 tablet (600 mg total) by mouth every 6 (six) hours as needed. 30 tablet 0  . Prenatal Vit-Fe Fumarate-FA (PRENATAL MULTIVITAMIN) TABS tablet Take 1 tablet by mouth daily at 12 noon. 30 tablet 3   No current facility-administered medications for this visit.     Allergies Patient has no known allergies.  Physical Exam:  BP 116/84 (BP Location: Right Arm, Cuff Size: Normal)   Pulse 93   Wt 195 lb 8 oz (88.7 kg)    Breastfeeding? Yes   BMI 34.63 kg/m  Body mass index is 34.63 kg/m. General appearance: Well nourished, well developed female in no acute distress.  Cardiovascular: regular rate and rhythm Respiratory:  Clear to auscultation bilateral. Normal respiratory effort Abdomen: positive bowel sounds and no masses, hernias; diffusely non tender to palpation, non distended Breasts: not examined. Neuro/Psych:  Normal mood and affect.  Skin:  Warm and dry.   PP Depression Screening:  negative  Assessment: Patient is a 32 y.o. [id number] who is 4 weeks post partum from a SVD. She is doing well. Received depo today.  Plan:  1. Postpartum care and examination Routine exam No issues  2. Contraception For depo - POCT urine pregnancy  3. NSVD (normal spontaneous vaginal delivery)  4. Abnormal maternal serum alpha-fetoprotein  5. Gestational diabetes mellitus (GDM) in third trimester, gestational diabetes method of control unspecified Return 2 weeks for 6 weeks GTT   RTC 1 yr or prn   K. Therese Sarah, M.D. Attending Obstetrician & Gynecologist, [facility] for [employer], [facility]

## 2017-11-10 NOTE — Progress Notes (Signed)
UPT today is Negative. Next DEPO 8/14-28/2019.  Administrations This Visit    medroxyPROGESTERone (DEPO-PROVERA) injection 150 mg    Admin Date 11/10/2017 Action Given Dose 150 mg Route Intramuscular Administered By Maretta Bees, RMA

## 2017-11-24 ENCOUNTER — Other Ambulatory Visit: Payer: Medicaid Other

## 2017-11-30 ENCOUNTER — Other Ambulatory Visit: Payer: Medicaid Other

## 2017-11-30 DIAGNOSIS — O24419 Gestational diabetes mellitus in pregnancy, unspecified control: Secondary | ICD-10-CM

## 2017-12-01 LAB — GLUCOSE TOLERANCE, 2 HOURS
Glucose, 2 hour: 94 mg/dL (ref 65–139)
Glucose, GTT - Fasting: 88 mg/dL (ref 65–99)

## 2018-02-01 ENCOUNTER — Ambulatory Visit: Payer: Self-pay

## 2018-02-09 ENCOUNTER — Ambulatory Visit (INDEPENDENT_AMBULATORY_CARE_PROVIDER_SITE_OTHER): Payer: Self-pay

## 2018-02-09 DIAGNOSIS — Z3042 Encounter for surveillance of injectable contraceptive: Secondary | ICD-10-CM

## 2018-02-09 MED ORDER — MEDROXYPROGESTERONE ACETATE 150 MG/ML IM SUSP
150.0000 mg | Freq: Once | INTRAMUSCULAR | Status: AC
  Administered 2018-02-09: 150 mg via INTRAMUSCULAR

## 2018-02-09 NOTE — Progress Notes (Signed)
Nurse visit for office supply Depo. Pt did not bring her own rx. Depo given L Del w/o diffiuclty. Next Depo due Nov 13-27, pt agrees.

## 2018-02-11 NOTE — Progress Notes (Signed)
Agree with A & P. 

## 2018-05-03 ENCOUNTER — Other Ambulatory Visit: Payer: Self-pay | Admitting: *Deleted

## 2018-05-03 ENCOUNTER — Ambulatory Visit: Payer: Self-pay

## 2018-05-03 DIAGNOSIS — Z3042 Encounter for surveillance of injectable contraceptive: Secondary | ICD-10-CM

## 2018-05-03 MED ORDER — MEDROXYPROGESTERONE ACETATE 150 MG/ML IM SUSP: 150.0000 mg | INTRAMUSCULAR | 0 refills | Status: DC

## 2018-05-03 NOTE — Progress Notes (Signed)
Order for Depo sent to pharmacy.  Pt is due depo now and did not have Rx at pharmacy.

## 2018-05-04 ENCOUNTER — Ambulatory Visit (INDEPENDENT_AMBULATORY_CARE_PROVIDER_SITE_OTHER): Payer: Self-pay

## 2018-05-04 DIAGNOSIS — Z3042 Encounter for surveillance of injectable contraceptive: Secondary | ICD-10-CM

## 2018-05-04 MED ORDER — MEDROXYPROGESTERONE ACETATE 150 MG/ML IM SUSP
150.0000 mg | Freq: Once | INTRAMUSCULAR | Status: AC
  Administered 2018-05-04: 150 mg via INTRAMUSCULAR

## 2018-05-04 NOTE — Progress Notes (Signed)
Pt presents for her depo injection. Pt tolerated well. Injection given in Right Deltoid. Pt tolerated well. Next depo due 2/5-2/19.

## 2018-07-21 ENCOUNTER — Ambulatory Visit: Payer: Self-pay

## 2018-08-03 ENCOUNTER — Ambulatory Visit: Payer: Self-pay

## 2018-08-04 ENCOUNTER — Telehealth: Payer: Self-pay | Admitting: Obstetrics

## 2018-08-04 ENCOUNTER — Encounter: Payer: Self-pay | Admitting: Obstetrics

## 2018-08-04 NOTE — Telephone Encounter (Signed)
Mailed letter to call office to reschedule Depo appointment

## 2018-11-21 ENCOUNTER — Encounter (HOSPITAL_COMMUNITY): Payer: Self-pay

## 2018-11-21 ENCOUNTER — Inpatient Hospital Stay (HOSPITAL_COMMUNITY): Payer: BLUE CROSS/BLUE SHIELD

## 2018-11-21 ENCOUNTER — Encounter (HOSPITAL_COMMUNITY): Payer: Self-pay | Admitting: *Deleted

## 2018-11-21 ENCOUNTER — Other Ambulatory Visit: Payer: Self-pay

## 2018-11-21 ENCOUNTER — Ambulatory Visit (INDEPENDENT_AMBULATORY_CARE_PROVIDER_SITE_OTHER)
Admission: EM | Admit: 2018-11-21 | Discharge: 2018-11-21 | Disposition: A | Discharge status: Home / Self Care | Payer: BLUE CROSS/BLUE SHIELD | Source: Home / Self Care | Attending: Family Medicine | Admitting: Family Medicine

## 2018-11-21 ENCOUNTER — Inpatient Hospital Stay (HOSPITAL_COMMUNITY)
Admission: AD | Admit: 2018-11-21 | Discharge: 2018-11-21 | Disposition: A | Discharge status: Home / Self Care | Payer: BLUE CROSS/BLUE SHIELD | Source: Ambulatory Visit | Attending: Obstetrics & Gynecology | Admitting: Obstetrics & Gynecology

## 2018-11-21 DIAGNOSIS — Z3201 Encounter for pregnancy test, result positive: Secondary | ICD-10-CM | POA: Diagnosis not present

## 2018-11-21 DIAGNOSIS — R1084 Generalized abdominal pain: Secondary | ICD-10-CM | POA: Diagnosis not present

## 2018-11-21 DIAGNOSIS — Z3A01 Less than 8 weeks gestation of pregnancy: Secondary | ICD-10-CM | POA: Insufficient documentation

## 2018-11-21 DIAGNOSIS — Z6740 Type O blood, Rh positive: Secondary | ICD-10-CM

## 2018-11-21 DIAGNOSIS — O26891 Other specified pregnancy related conditions, first trimester: Secondary | ICD-10-CM | POA: Insufficient documentation

## 2018-11-21 DIAGNOSIS — Z3490 Encounter for supervision of normal pregnancy, unspecified, unspecified trimester: Secondary | ICD-10-CM

## 2018-11-21 DIAGNOSIS — R109 Unspecified abdominal pain: Secondary | ICD-10-CM | POA: Diagnosis present

## 2018-11-21 DIAGNOSIS — O360910 Maternal care for other rhesus isoimmunization, first trimester, not applicable or unspecified: Secondary | ICD-10-CM | POA: Diagnosis not present

## 2018-11-21 DIAGNOSIS — R102 Pelvic and perineal pain: Secondary | ICD-10-CM | POA: Diagnosis not present

## 2018-11-21 LAB — POCT URINALYSIS DIP (DEVICE)
Bilirubin Urine: NEGATIVE
Glucose, UA: NEGATIVE mg/dL
Hgb urine dipstick: NEGATIVE
Ketones, ur: NEGATIVE mg/dL
Leukocytes,Ua: NEGATIVE
Nitrite: NEGATIVE
Protein, ur: NEGATIVE mg/dL
Specific Gravity, Urine: >=1.03 (ref 1.005–1.030)
Urobilinogen, UA: 0.2 mg/dL (ref 0.0–1.0)
pH: 6 (ref 5.0–8.0)

## 2018-11-21 LAB — POCT PREGNANCY, URINE: Preg Test, Ur: POSITIVE — AB

## 2018-11-21 LAB — CBC WITH DIFFERENTIAL/PLATELET
Abs Immature Granulocytes: 0.02 10*3/uL (ref 0.00–0.07)
Basophils Absolute: 0 10*3/uL (ref 0.0–0.1)
Basophils Relative: 0 %
Eosinophils Absolute: 0.3 10*3/uL (ref 0.0–0.5)
Eosinophils Relative: 6 %
HCT: 38.9 % (ref 36.0–46.0)
Hemoglobin: 12.2 g/dL (ref 12.0–15.0)
Immature Granulocytes: 0 %
Lymphocytes Relative: 31 %
Lymphs Abs: 1.7 10*3/uL (ref 0.7–4.0)
MCH: 28.2 pg (ref 26.0–34.0)
MCHC: 31.4 g/dL (ref 30.0–36.0)
MCV: 90 fL (ref 80.0–100.0)
Monocytes Absolute: 0.4 10*3/uL (ref 0.1–1.0)
Monocytes Relative: 8 %
Neutro Abs: 2.9 10*3/uL (ref 1.7–7.7)
Neutrophils Relative %: 55 %
Platelets: 227 10*3/uL (ref 150–400)
RBC: 4.32 MIL/uL (ref 3.87–5.11)
RDW: 13 % (ref 11.5–15.5)
WBC: 5.3 10*3/uL (ref 4.0–10.5)
nRBC: 0 % (ref 0.0–0.2)

## 2018-11-21 LAB — WET PREP, GENITAL
Clue Cells Wet Prep HPF POC: NONE SEEN
Sperm: NONE SEEN
Trich, Wet Prep: NONE SEEN
Yeast Wet Prep HPF POC: NONE SEEN

## 2018-11-21 LAB — HCG, QUANTITATIVE, PREGNANCY: hCG, Beta Chain, Quant, S: 69314 m[IU]/mL — ABNORMAL HIGH (ref <5)

## 2018-11-21 NOTE — ED Triage Notes (Signed)
Patient presents to Urgent Care with complaints of abdominal pain since yesterday. Patient reports she recently d/c'd her birth control.

## 2018-11-21 NOTE — Discharge Instructions (Signed)
[facility] Prenatal Care Providers °         °[facility]  ° Phone: [phone] ° °[facility] @ [facility]  ° Phone: [phone] ° °[facility]      ° Phone: [phone]   °         °[facility] @ [place]    ° Phone: [phone] °         °[facility] @ [place]  ° Phone: [phone] ° °[facility] @ [facility] ° Phone: [phone] °    °[facility] ([place]) ° Phone: [phone] ° °

## 2018-11-21 NOTE — ED Provider Notes (Signed)
[place]   [id number] 11/21/18 Arrival Time: 3235  ASSESSMENT & PLAN:  1. Generalized abdominal pain   2. Positive urine pregnancy test    Taken via wheelchair to MAU to r/o ectopic pregnancy. Stable upon discharge.  Follow-up Information    [facility].   Specialty:  Maternal and Fetal Medicine Contact information: [address] [place], [place] [id number] [place] [id number] [phone]         Reviewed expectations re: course of current medical issues. Questions answered. Outlined signs and symptoms indicating need for more acute intervention. Patient verbalized understanding. After Visit Summary given.   SUBJECTIVE: History from: patient. Ariana [name] is a 33 y.o. female who presents with complaint of fairly persistent generalized abdominal pain. Onset gradual, noticed yesterday. She cannot describe pain; "just hurts"; without radiation. Symptoms are unchanged since beginning. Fever: none reported. Aggravating factors: have not been identified. Alleviating factors: have not been identified. She denies arthralgias, chills, constipation, diarrhea, headache, myalgias, sweats and vomiting. Appetite: mildly decreased. Mild nausea. PO intake: normal. Ambulatory without assistance. Urinary symptoms: none reported. No vaginal discharge or bleeding. Bowel movements: have not significantly changed; without blood. OTC treatment: none reported.  No LMP recorded. Recently d/c injectable birth control. Questions menstrual period in April or May; is ussure.  History reviewed. No pertinent surgical history.  ROS: As per HPI. All other systems negative.  OBJECTIVE:  Vitals:   11/21/18 0916  BP: 117/81  Pulse: 73  Resp: 16  Temp: 98.2 F (36.8 C)  TempSrc: Oral  SpO2: 98%    General appearance: alert, oriented, no acute distress but appears uncomfortable Lungs: clear to auscultation bilaterally; unlabored  respirations Heart: regular rate and rhythm Abdomen: soft; without distention; poorly localized tenderness to palpation over abdomen; present bowel sounds; without guarding or rebound tenderness but she does pull back when I push on her abdomen Back: without CVA tenderness; FROM at waist Extremities: without LE edema; symmetrical; without gross deformities Skin: warm and dry Neurologic: normal gait Psychological: alert and cooperative; normal mood and affect  Labs: Results for orders placed or performed during the hospital encounter of 11/21/18  POCT urinalysis dip (device)  Result Value Ref Range   Glucose, UA NEGATIVE NEGATIVE mg/dL   Bilirubin Urine NEGATIVE NEGATIVE   Ketones, ur NEGATIVE NEGATIVE mg/dL   Specific Gravity, Urine >=1.030 1.005 - 1.030   Hgb urine dipstick NEGATIVE NEGATIVE   pH 6.0 5.0 - 8.0   Protein, ur NEGATIVE NEGATIVE mg/dL   Urobilinogen, UA 0.2 0.0 - 1.0 mg/dL   Nitrite NEGATIVE NEGATIVE   Leukocytes,Ua NEGATIVE NEGATIVE  Pregnancy, urine POC  Result Value Ref Range   Preg Test, Ur POSITIVE (A) NEGATIVE   No Known Allergies                                             PMH: Gestational diabetes.  Social History   Socioeconomic History  . Marital status: Married    Spouse name: Not on file  . Number of children: Not on file  . Years of education: Not on file  . Highest education level: Not on file  Occupational History  . Not on file  Social Needs  . Financial resource strain: Not on file  . Food insecurity:    Worry: Not on file    Inability: Not on  file  . Transportation needs:    Medical: Not on file    Non-medical: Not on file  Tobacco Use  . Smoking status: Never Smoker  . Smokeless tobacco: Never Used  Substance and Sexual Activity  . Alcohol use: No  . Drug use: No  . Sexual activity: Yes    Birth control/protection: None  Lifestyle  . Physical activity:    Days per week: Not on file    Minutes per session: Not on file  .  Stress: Not on file  Relationships  . Social connections:    Talks on phone: Not on file    Gets together: Not on file    Attends religious service: Not on file    Active member of club or organization: Not on file    Attends meetings of clubs or organizations: Not on file    Relationship status: Not on file  . Intimate partner violence:    Fear of current or ex partner: Not on file    Emotionally abused: Not on file    Physically abused: Not on file    Forced sexual activity: Not on file  Other Topics Concern  . Not on file  Social History Narrative  . Not on file   Family History  Problem Relation Age of Onset  . Healthy Mother      Mardella LaymanHagler, Kiandra Sanguinetti, MD 11/21/18 [phone]

## 2018-11-21 NOTE — ED Notes (Signed)
Patient is being transferred from the Urgent [facility] to MAU via wheelchair by staff. Patient is stable but in need of higher level of care due to abdominal pain with pregnancy, MD requesting r/o ectopic pregnancy. Patient is aware and verbalizes understanding of plan of care.  Vitals:   11/21/18 0916  BP: 117/81  Pulse: 73  Resp: 16  Temp: 98.2 F (36.8 C)  SpO2: 98%

## 2018-11-21 NOTE — MAU Provider Note (Signed)
History     CSN: [id number]  Arrival date and time: 11/21/18 [id number]   First Provider Initiated Contact with Patient 11/21/18 1049      Chief Complaint  Patient presents with  . Abdominal Pain   Ariana [name] is a 33 y.o. [id number] at 3813w4d who presents today with abdominal pain. She denies any vaginal bleeding or vaginal discharge. She reports that she had been on depo for contraception "a long time ago", and doesn't remember the last time she had an injection. LMP: 09/29/2018  Abdominal Pain  This is a new problem. The current episode started today. The onset quality is gradual. The problem occurs intermittently. The problem has been unchanged. The pain is located in the periumbilical region. The pain is at a severity of 7/10. The quality of the pain is sharp. The abdominal pain does not radiate. Pertinent negatives include no dysuria, fever, frequency, nausea or vomiting. Nothing aggravates the pain. The pain is relieved by nothing. She has tried nothing for the symptoms.    OB History    Gravida  5   Para  4   Term  4   Preterm      AB      Living  4     SAB      TAB      Ectopic      Multiple  0   Live Births  4           History reviewed. No pertinent past medical history.  History reviewed. No pertinent surgical history.  Family History  Problem Relation Age of Onset  . Healthy Mother     Social History   Tobacco Use  . Smoking status: Never Smoker  . Smokeless tobacco: Never Used  Substance Use Topics  . Alcohol use: No  . Drug use: No    Allergies: No Known Allergies  Medications Prior to Admission  Medication Sig Dispense Refill Last Dose  . Prenatal Vit-Fe Fumarate-FA (PRENATAL MULTIVITAMIN) TABS tablet Take 1 tablet by mouth daily at 12 noon. 30 tablet 3 11/21/2018 at Unknown time  . ibuprofen (ADVIL,MOTRIN) 600 MG tablet Take 1 tablet (600 mg total) by mouth every 6 (six) hours as needed. 30 tablet 0   . medroxyPROGESTERone (DEPO-PROVERA)  150 MG/ML injection Inject 1 mL (150 mg total) into the muscle every 3 (three) months. 1 mL 0     Review of Systems  Constitutional: Negative for chills and fever.  Gastrointestinal: Positive for abdominal pain. Negative for nausea and vomiting.  Genitourinary: Negative for dysuria, frequency, vaginal bleeding and vaginal discharge.   Physical Exam   Blood pressure 128/84, pulse 74, temperature 98.4 F (36.9 C), resp. rate 16, height 5\' 4"  (1.626 m), weight 88.9 kg, last menstrual period 09/29/2018, SpO2 100 %, currently breastfeeding.  Physical Exam  Nursing note and vitals reviewed. Constitutional: She is oriented to person, place, and time. She appears well-developed and well-nourished. No distress.  HENT:  Head: Normocephalic.  Cardiovascular: Normal rate.  Respiratory: Effort normal.  GI: Soft. There is abdominal tenderness (Peri-umbilical ). There is no rebound and no guarding.  Musculoskeletal: Normal range of motion.  Neurological: She is alert and oriented to person, place, and time.  Skin: Skin is warm and dry.  Psychiatric: She has a normal mood and affect.   Results for orders placed or performed during the hospital encounter of 11/21/18 (from the past 24 hour(s))  CBC with Differential/Platelet     Status: None  Collection Time: 11/21/18 10:51 AM  Result Value Ref Range   WBC 5.3 4.0 - 10.5 K/uL   RBC 4.32 3.87 - 5.11 MIL/uL   Hemoglobin 12.2 12.0 - 15.0 g/dL   HCT 38.9 36.0 - 46.0 %   MCV 90.0 80.0 - 100.0 fL   MCH 28.2 26.0 - 34.0 pg   MCHC 31.4 30.0 - 36.0 g/dL   RDW 13.0 11.5 - 15.5 %   Platelets 227 150 - 400 K/uL   nRBC 0.0 0.0 - 0.2 %   Neutrophils Relative % 55 %   Neutro Abs 2.9 1.7 - 7.7 K/uL   Lymphocytes Relative 31 %   Lymphs Abs 1.7 0.7 - 4.0 K/uL   Monocytes Relative 8 %   Monocytes Absolute 0.4 0.1 - 1.0 K/uL   Eosinophils Relative 6 %   Eosinophils Absolute 0.3 0.0 - 0.5 K/uL   Basophils Relative 0 %   Basophils Absolute 0.0 0.0 - 0.1  K/uL   Immature Granulocytes 0 %   Abs Immature Granulocytes 0.02 0.00 - 0.07 K/uL  Wet prep, genital     Status: Abnormal   Collection Time: 11/21/18 11:03 AM  Result Value Ref Range   Yeast Wet Prep HPF POC NONE SEEN NONE SEEN   Trich, Wet Prep NONE SEEN NONE SEEN   Clue Cells Wet Prep HPF POC NONE SEEN NONE SEEN   WBC, Wet Prep HPF POC FEW (A) NONE SEEN   Sperm NONE SEEN    US Ob Less Than 14 Weeks With Ob Transvaginal  Result Date: 11/21/2018 CLINICAL DATA:  Pelvic pain EXAM: OBSTETRIC <14 WK [place] AND TRANSVAGINAL OB US TECHNIQUE: Both transabdominal and transvaginal ultrasound examinations were performed for complete evaluation of the gestation as well as the maternal uterus, adnexal regions, and pelvic cul-de-sac. Transvaginal technique was performed to assess early pregnancy. COMPARISON:  None. FINDINGS: Intrauterine gestational sac: Visualized Yolk sac:  Visualized Embryo:  Visualized Cardiac Activity: Visualized Heart Rate: 122 bpm CRL:  7 mm   6 w   3 d                  [place] EDC: July 13, 2009 Subchorionic hemorrhage:  None visualized. Maternal uterus/adnexae: Cervical os is closed. Right ovary measures 2.6 x 1.7 x 1.6 cm. Left ovary measures 3.5 x 1.9 x 2 3 cm. No maternal free pelvic fluid. IMPRESSION: Single live intrauterine gestation with estimated gestational age of 6+ weeks. No subchorionic hemorrhage evident. Study otherwise unremarkable. Electronically Signed   By: Lowella Grip III M.D.   On: 11/21/2018 11:30     MAU Course  Procedures  MDM   Assessment and Plan   1. Intrauterine pregnancy   2. Pelvic pain in pregnancy, antepartum, first trimester   3. [redacted] weeks gestation of pregnancy   4. Type O blood, Rh positive    DC home Comfort measures reviewed  1st Trimester precautions  RX: none  Return to MAU as needed Start [facility] as soon as possible   Follow-up Information    Department, [facility] Follow up.   Contact information: [place] [address] [place] DNP, CNM  11/21/18  11:54 AM

## 2018-11-21 NOTE — MAU Note (Signed)
.   Ariana [name] is a 33 y.o. at [redacted]w[redacted]d here in MAU reporting: lower abdominal cramping that started this morning. Pt denies any vaginal bleeding or abnormal discharge LMP: 09/29/18 Onset of complaint: this morning Pain score: 7 Vitals:   11/21/18 1023 11/21/18 1024  BP: 128/84   Pulse: 74   Resp: 16   Temp: 98.4 F (36.9 C)   SpO2: 100% 100%     FHT Lab orders placed from triage: UA

## 2018-11-22 LAB — GC/CHLAMYDIA PROBE AMP (~~LOC~~) NOT AT ARMC
Chlamydia: NEGATIVE
Neisseria Gonorrhea: NEGATIVE

## 2019-01-06 ENCOUNTER — Encounter: Payer: Self-pay | Admitting: Obstetrics

## 2019-01-06 ENCOUNTER — Ambulatory Visit (INDEPENDENT_AMBULATORY_CARE_PROVIDER_SITE_OTHER): Payer: Medicaid Other | Admitting: Obstetrics and Gynecology

## 2019-01-06 ENCOUNTER — Encounter: Payer: Self-pay | Admitting: Obstetrics and Gynecology

## 2019-01-06 ENCOUNTER — Other Ambulatory Visit (HOSPITAL_COMMUNITY)
Admission: RE | Admit: 2019-01-06 | Discharge: 2019-01-06 | Disposition: A | Discharge status: Home / Self Care | Payer: Medicaid Other | Source: Ambulatory Visit | Attending: Obstetrics and Gynecology | Admitting: Obstetrics and Gynecology

## 2019-01-06 ENCOUNTER — Other Ambulatory Visit: Payer: Self-pay

## 2019-01-06 DIAGNOSIS — Z3482 Encounter for supervision of other normal pregnancy, second trimester: Secondary | ICD-10-CM | POA: Diagnosis not present

## 2019-01-06 DIAGNOSIS — Z315 Encounter for genetic counseling: Secondary | ICD-10-CM | POA: Diagnosis not present

## 2019-01-06 DIAGNOSIS — Z3480 Encounter for supervision of other normal pregnancy, unspecified trimester: Secondary | ICD-10-CM | POA: Diagnosis not present

## 2019-01-06 DIAGNOSIS — O219 Vomiting of pregnancy, unspecified: Secondary | ICD-10-CM | POA: Insufficient documentation

## 2019-01-06 DIAGNOSIS — O09299 Supervision of pregnancy with other poor reproductive or obstetric history, unspecified trimester: Secondary | ICD-10-CM

## 2019-01-06 DIAGNOSIS — Z3A13 13 weeks gestation of pregnancy: Secondary | ICD-10-CM

## 2019-01-06 DIAGNOSIS — O09291 Supervision of pregnancy with other poor reproductive or obstetric history, first trimester: Secondary | ICD-10-CM | POA: Diagnosis not present

## 2019-01-06 DIAGNOSIS — Z8632 Personal history of gestational diabetes: Secondary | ICD-10-CM | POA: Insufficient documentation

## 2019-01-06 MED ORDER — PRENATE PIXIE 10-0.6-0.4-200 MG PO CAPS: 1.0000 | ORAL_CAPSULE | Freq: Every day | ORAL | 11 refills | Status: DC

## 2019-01-06 MED ORDER — BLOOD PRESSURE MONITOR KIT: 1.0000 | PACK | Freq: Once | 0 refills | Status: DC

## 2019-01-06 MED ORDER — ONDANSETRON 4 MG PO TBDP: 4.0000 mg | ORAL_TABLET | Freq: Four times a day (QID) | ORAL | 3 refills | Status: DC | PRN

## 2019-01-06 MED ORDER — GLYCOPYRROLATE 2 MG PO TABS: 2.0000 mg | ORAL_TABLET | Freq: Three times a day (TID) | ORAL | 3 refills | Status: DC | PRN

## 2019-01-06 NOTE — Progress Notes (Signed)
Subjective:  Ariana [name] is a 33 y.o. G5P4004 at [redacted]w[redacted]d being seen today for first OB visit. EDD by first trimester U/S. No chronic medical problems or medications. H/O diet controlled GDM with last pregnancy.  She is currently monitored for the following issues for this low-risk pregnancy and has Supervision of other normal pregnancy, antepartum; Nausea and vomiting during pregnancy; and History of gestational diabetes in prior pregnancy, currently pregnant on their problem list.  Patient reports N/V and spitting.  Contractions: Not present. Vag. Bleeding: None.   . Denies leaking of fluid.   The following portions of the patient's history were reviewed and updated as appropriate: allergies, current medications, past family history, past medical history, past social history, past surgical history and problem list. Problem list updated.  Objective:   Vitals:   01/06/19 0835  BP: 116/82  Pulse: (!) 106  Weight: 178 lb (80.7 kg)    Fetal Status: Fetal Heart Rate (bpm): 165         General:  Alert, oriented and cooperative. Patient is in no acute distress.  Skin: Skin is warm and dry. No rash noted.   Cardiovascular: Normal heart rate noted  Respiratory: Normal respiratory effort, no problems with respiration noted  Abdomen: Soft, gravid, appropriate for gestational age. Pain/Pressure: Present     Pelvic:  Cervical exam deferred        Extremities: Normal range of motion.     Mental Status: Normal mood and affect. Normal behavior. Normal judgment and thought content.   Urinalysis:      Assessment and Plan:  Pregnancy: [id number] at [redacted]w[redacted]d  1. Supervision of other normal pregnancy, antepartum Prenatal care and labs reviewed with pt. Genetic testing reviewed   2. Nausea and vomiting during pregnancy Zofran and Robinul  3. History of gestational diabetes in prior pregnancy, currently pregnant Will check HgbA1c  Preterm labor symptoms and general obstetric precautions including but  not limited to vaginal bleeding, contractions, leaking of fluid and fetal movement were reviewed in detail with the patient. Please refer to After Visit Summary for other counseling recommendations.  Return in about 4 weeks (around 02/03/2019) for OB visit, virtual.   Chancy Milroy, MD

## 2019-01-06 NOTE — Patient Instructions (Signed)
First Trimester of Pregnancy The first trimester of pregnancy is from week 1 until the end of week 13 (months 1 through 3). A week after a sperm fertilizes an egg, the egg will implant on the wall of the uterus. This embryo will begin to develop into a baby. Genes from you and your partner will form the baby. The female genes will determine whether the baby will be a boy or a girl. At 6-8 weeks, the eyes and face will be formed, and the heartbeat can be seen on ultrasound. At the end of 12 weeks, all the baby's organs will be formed. Now that you are pregnant, you will want to do everything you can to have a healthy baby. Two of the most important things are to get good prenatal care and to follow your health care provider's instructions. Prenatal care is all the medical care you receive before the baby's birth. This care will help prevent, find, and treat any problems during the pregnancy and childbirth. Body changes during your first trimester Your body goes through many changes during pregnancy. The changes vary from woman to woman.  You may gain or lose a couple of pounds at first.  You may feel sick to your stomach (nauseous) and you may throw up (vomit). If the vomiting is uncontrollable, call your health care provider.  You may tire easily.  You may develop headaches that can be relieved by medicines. All medicines should be approved by your health care provider.  You may urinate more often. Painful urination may mean you have a bladder infection.  You may develop heartburn as a result of your pregnancy.  You may develop constipation because certain hormones are causing the muscles that push stool through your intestines to slow down.  You may develop hemorrhoids or swollen veins (varicose veins).  Your breasts may begin to grow larger and become tender. Your nipples may stick out more, and the tissue that surrounds them (areola) may become darker.  Your gums may bleed and may be  sensitive to brushing and flossing.  Dark spots or blotches (chloasma, mask of pregnancy) may develop on your face. This will likely fade after the baby is born.  Your menstrual periods will stop.  You may have a loss of appetite.  You may develop cravings for certain kinds of food.  You may have changes in your emotions from day to day, such as being excited to be pregnant or being concerned that something may go wrong with the pregnancy and baby.  You may have more vivid and strange dreams.  You may have changes in your hair. These can include thickening of your hair, rapid growth, and changes in texture. Some women also have hair loss during or after pregnancy, or hair that feels dry or thin. Your hair will most likely return to normal after your baby is born. What to expect at prenatal visits During a routine prenatal visit:  You will be weighed to make sure you and the baby are growing normally.  Your blood pressure will be taken.  Your abdomen will be measured to track your baby's growth.  The fetal heartbeat will be listened to between weeks 10 and 14 of your pregnancy.  Test results from any previous visits will be discussed. Your health care provider may ask you:  How you are feeling.  If you are feeling the baby move.  If you have had any abnormal symptoms, such as leaking fluid, bleeding, severe headaches, or abdominal   cramping.  If you are using any tobacco products, including cigarettes, chewing tobacco, and electronic cigarettes.  If you have any questions. Other tests that may be performed during your first trimester include:  Blood tests to find your blood type and to check for the presence of any previous infections. The tests will also be used to check for low iron levels (anemia) and protein on red blood cells (Rh antibodies). Depending on your risk factors, or if you previously had diabetes during pregnancy, you may have tests to check for high blood sugar  that affects pregnant women (gestational diabetes).  Urine tests to check for infections, diabetes, or protein in the urine.  An ultrasound to confirm the proper growth and development of the baby.  Fetal screens for spinal cord problems (spina bifida) and Down syndrome.  HIV (human immunodeficiency virus) testing. Routine prenatal testing includes screening for HIV, unless you choose not to have this test.  You may need other tests to make sure you and the baby are doing well. Follow these instructions at home: Medicines  Follow your health care provider's instructions regarding medicine use. Specific medicines may be either safe or unsafe to take during pregnancy.  Take a prenatal vitamin that contains at least 600 micrograms (mcg) of folic acid.  If you develop constipation, try taking a stool softener if your health care provider approves. Eating and drinking   Eat a balanced diet that includes fresh fruits and vegetables, whole grains, good sources of protein such as meat, eggs, or tofu, and low-fat dairy. Your health care provider will help you determine the amount of weight gain that is right for you.  Avoid raw meat and uncooked cheese. These carry germs that can cause birth defects in the baby.  Eating four or five small meals rather than three large meals a day may help relieve nausea and vomiting. If you start to feel nauseous, eating a few soda crackers can be helpful. Drinking liquids between meals, instead of during meals, also seems to help ease nausea and vomiting.  Limit foods that are high in fat and processed sugars, such as fried and sweet foods.  To prevent constipation: ? Eat foods that are high in fiber, such as fresh fruits and vegetables, whole grains, and beans. ? Drink enough fluid to keep your urine clear or pale yellow. Activity  Exercise only as directed by your health care provider. Most women can continue their usual exercise routine during  pregnancy. Try to exercise for 30 minutes at least 5 days a week. Exercising will help you: ? Control your weight. ? Stay in shape. ? Be prepared for labor and delivery.  Experiencing pain or cramping in the lower abdomen or lower back is a good sign that you should stop exercising. Check with your health care provider before continuing with normal exercises.  Try to avoid standing for long periods of time. Move your legs often if you must stand in one place for a long time.  Avoid heavy lifting.  Wear low-heeled shoes and practice good posture.  You may continue to have sex unless your health care provider tells you not to. Relieving pain and discomfort  Wear a good support bra to relieve breast tenderness.  Take warm sitz baths to soothe any pain or discomfort caused by hemorrhoids. Use hemorrhoid cream if your health care provider approves.  Rest with your legs elevated if you have leg cramps or low back pain.  If you develop varicose veins in   your legs, wear support hose. Elevate your feet for 15 minutes, 3-4 times a day. Limit salt in your diet. Prenatal care  Schedule your prenatal visits by the twelfth week of pregnancy. They are usually scheduled monthly at first, then more often in the last 2 months before delivery.  Write down your questions. Take them to your prenatal visits.  Keep all your prenatal visits as told by your health care provider. This is important. Safety  Wear your seat belt at all times when driving.  Make a list of emergency phone numbers, including numbers for family, friends, the hospital, and police and fire departments. General instructions  Ask your health care provider for a referral to a local prenatal education class. Begin classes no later than the beginning of month 6 of your pregnancy.  Ask for help if you have counseling or nutritional needs during pregnancy. Your health care provider can offer advice or refer you to specialists for help  with various needs.  Do not use hot tubs, steam rooms, or saunas.  Do not douche or use tampons or scented sanitary pads.  Do not cross your legs for long periods of time.  Avoid cat litter boxes and soil used by cats. These carry germs that can cause birth defects in the baby and possibly loss of the fetus by miscarriage or stillbirth.  Avoid all smoking, herbs, alcohol, and medicines not prescribed by your health care provider. Chemicals in these products affect the formation and growth of the baby.  Do not use any products that contain nicotine or tobacco, such as cigarettes and e-cigarettes. If you need help quitting, ask your health care provider. You may receive counseling support and other resources to help you quit.  Schedule a dentist appointment. At home, brush your teeth with a soft toothbrush and be gentle when you floss. Contact a health care provider if:  You have dizziness.  You have mild pelvic cramps, pelvic pressure, or nagging pain in the abdominal area.  You have persistent nausea, vomiting, or diarrhea.  You have a bad smelling vaginal discharge.  You have pain when you urinate.  You notice increased swelling in your face, hands, legs, or ankles.  You are exposed to fifth disease or chickenpox.  You are exposed to German measles (rubella) and have never had it. Get help right away if:  You have a fever.  You are leaking fluid from your vagina.  You have spotting or bleeding from your vagina.  You have severe abdominal cramping or pain.  You have rapid weight gain or loss.  You vomit blood or material that looks like coffee grounds.  You develop a severe headache.  You have shortness of breath.  You have any kind of trauma, such as from a fall or a car accident. Summary  The first trimester of pregnancy is from week 1 until the end of week 13 (months 1 through 3).  Your body goes through many changes during pregnancy. The changes vary from  woman to woman.  You will have routine prenatal visits. During those visits, your health care provider will examine you, discuss any test results you may have, and talk with you about how you are feeling. This information is not intended to replace advice given to you by your health care provider. Make sure you discuss any questions you have with your health care provider. Document Released: 05/26/2001 Document Revised: 05/14/2017 Document Reviewed: 05/13/2016 Elsevier Patient Education  2020 [employer]  

## 2019-01-07 LAB — OBSTETRIC PANEL, INCLUDING HIV
Antibody Screen: NEGATIVE
Basophils Absolute: 0 10*3/uL (ref 0.0–0.2)
Basos: 0 %
EOS (ABSOLUTE): 0.3 10*3/uL (ref 0.0–0.4)
Eos: 6 %
HIV Screen 4th Generation wRfx: NONREACTIVE
Hematocrit: 35.5 % (ref 34.0–46.6)
Hemoglobin: 11.5 g/dL (ref 11.1–15.9)
Hepatitis B Surface Ag: NEGATIVE
Immature Grans (Abs): 0 10*3/uL (ref 0.0–0.1)
Immature Granulocytes: 0 %
Lymphocytes Absolute: 1.3 10*3/uL (ref 0.7–3.1)
Lymphs: 24 %
MCH: 28.9 pg (ref 26.6–33.0)
MCHC: 32.4 g/dL (ref 31.5–35.7)
MCV: 89 fL (ref 79–97)
Monocytes Absolute: 0.4 10*3/uL (ref 0.1–0.9)
Monocytes: 8 %
Neutrophils Absolute: 3.2 10*3/uL (ref 1.4–7.0)
Neutrophils: 62 %
Platelets: 220 10*3/uL (ref 150–450)
RBC: 3.98 x10E6/uL (ref 3.77–5.28)
RDW: 12.4 % (ref 11.7–15.4)
RPR Ser Ql: NONREACTIVE
Rh Factor: POSITIVE
Rubella Antibodies, IGG: 25.3 index (ref >0.99)
WBC: 5.2 10*3/uL (ref 3.4–10.8)

## 2019-01-07 LAB — URINE CYTOLOGY ANCILLARY ONLY
Chlamydia: NEGATIVE
Neisseria Gonorrhea: NEGATIVE

## 2019-01-07 LAB — HEMOGLOBIN A1C
Est. average glucose Bld gHb Est-mCnc: 88 mg/dL
Hgb A1c MFr Bld: 4.7 % — ABNORMAL LOW (ref 4.8–5.6)

## 2019-01-10 LAB — URINE CULTURE, OB REFLEX

## 2019-01-10 LAB — CULTURE, OB URINE

## 2019-01-11 ENCOUNTER — Telehealth: Payer: Self-pay | Admitting: *Deleted

## 2019-01-11 MED ORDER — CLINDAMYCIN HCL 300 MG PO CAPS: 300.0000 mg | ORAL_CAPSULE | Freq: Three times a day (TID) | ORAL | 0 refills | Status: DC

## 2019-01-11 NOTE — Telephone Encounter (Signed)
Telephone call to patient regarding positive Group B strep in her urine culture.  Patient was not in, left detailed message regarding results and need to pick up Rx.   Clindamycin 300mg  TID x 7 days prescribed by Dr. Rip Harbour.

## 2019-01-13 ENCOUNTER — Encounter: Payer: Self-pay | Admitting: Obstetrics and Gynecology

## 2019-01-19 ENCOUNTER — Encounter: Payer: Self-pay | Admitting: Obstetrics and Gynecology

## 2019-01-22 ENCOUNTER — Encounter: Payer: Self-pay | Admitting: Obstetrics and Gynecology

## 2019-01-22 DIAGNOSIS — D563 Thalassemia minor: Secondary | ICD-10-CM | POA: Insufficient documentation

## 2019-01-23 ENCOUNTER — Other Ambulatory Visit: Payer: Self-pay

## 2019-01-23 DIAGNOSIS — D563 Thalassemia minor: Secondary | ICD-10-CM

## 2019-01-27 ENCOUNTER — Encounter (HOSPITAL_COMMUNITY): Payer: Self-pay | Admitting: Emergency Medicine

## 2019-01-27 ENCOUNTER — Other Ambulatory Visit: Payer: Self-pay

## 2019-01-27 ENCOUNTER — Ambulatory Visit (HOSPITAL_COMMUNITY)
Admission: EM | Admit: 2019-01-27 | Discharge: 2019-01-27 | Disposition: A | Discharge status: Home / Self Care | Payer: Medicaid Other | Attending: Emergency Medicine | Admitting: Emergency Medicine

## 2019-01-27 DIAGNOSIS — M546 Pain in thoracic spine: Secondary | ICD-10-CM | POA: Diagnosis not present

## 2019-01-27 DIAGNOSIS — R109 Unspecified abdominal pain: Secondary | ICD-10-CM | POA: Diagnosis not present

## 2019-01-27 LAB — POCT URINALYSIS DIP (DEVICE)
Glucose, UA: NEGATIVE mg/dL
Hgb urine dipstick: NEGATIVE
Ketones, ur: NEGATIVE mg/dL
Leukocytes,Ua: NEGATIVE
Nitrite: NEGATIVE
Protein, ur: 30 mg/dL — AB
Specific Gravity, Urine: 1.025 (ref 1.005–1.030)
Urobilinogen, UA: 2 mg/dL — ABNORMAL HIGH (ref 0.0–1.0)
pH: 6 (ref 5.0–8.0)

## 2019-01-27 MED ORDER — ACETAMINOPHEN 500 MG PO TABS: 500.0000 mg | ORAL_TABLET | Freq: Four times a day (QID) | ORAL | 0 refills | Status: DC | PRN

## 2019-01-27 NOTE — ED Triage Notes (Signed)
PT reports back pain and side pain that started yesterday. PT is approx 4 months pregnant. Denies discharge or bleeding.

## 2019-01-27 NOTE — ED Provider Notes (Signed)
[facility]    CSN: [id number] Arrival date & time: 01/27/19  1235     History   Chief Complaint Chief Complaint  Patient presents with  . Back Pain    HPI Ariana [name] is a 33 y.o. female.   Ariana [name] presents with complaints of bilateral flank pain as well as back pain which started yesterday and is worsening. No fevers. Some low abdominal pain. No urinary symptoms. She takes medication for nausea and vomiting which is currently controlled. Normal BM's. No known injury of back. No blood in urine. No vaginal bleeding or vaginal symptoms. Denies any previous similar. Hasn't taken any medications for symptoms. She is currently 16w pregnant. Her next OB appointment is 8/19. History of gestational diabetes.    ROS per HPI, negative if not otherwise mentioned.      History reviewed. No pertinent past medical history.  Patient Active Problem List   Diagnosis Date Noted  . Alpha thalassemia silent carrier 01/22/2019  . Supervision of other normal pregnancy, antepartum 01/06/2019  . Nausea and vomiting during pregnancy 01/06/2019  . History of gestational diabetes in prior pregnancy, currently pregnant 01/06/2019    History reviewed. No pertinent surgical history.  OB History    Gravida  5   Para  4   Term  4   Preterm      AB      Living  4     SAB      TAB      Ectopic      Multiple  0   Live Births  4            Home Medications    Prior to Admission medications   Medication Sig Start Date End Date Taking? Authorizing Provider  clindamycin (CLEOCIN) 300 MG capsule Take 1 capsule (300 mg total) by mouth 3 (three) times daily. 01/11/19  Yes Hermina StaggersErvin, Michael L, MD  ondansetron (ZOFRAN ODT) 4 MG disintegrating tablet Take 1 tablet (4 mg total) by mouth every 6 (six) hours as needed for nausea. 01/06/19  Yes Hermina StaggersErvin, Michael L, MD  Prenat-FeAsp-Meth-FA-DHA w/o A (PRENATE PIXIE) 10-0.6-0.4-200 MG CAPS Take 1 capsule by mouth daily. 01/06/19   Yes Hermina StaggersErvin, Michael L, MD  acetaminophen (TYLENOL) 500 MG tablet Take 1 tablet (500 mg total) by mouth every 6 (six) hours as needed. 01/27/19   Georgetta HaberBurky, Natalie B, NP  glycopyrrolate (ROBINUL) 2 MG tablet Take 1 tablet (2 mg total) by mouth 3 (three) times daily as needed. 01/06/19   Hermina StaggersErvin, Michael L, MD    Family History Family History  Problem Relation Age of Onset  . Healthy Mother     Social History Social History   Tobacco Use  . Smoking status: Never Smoker  . Smokeless tobacco: Never Used  Substance Use Topics  . Alcohol use: No  . Drug use: No     Allergies   5-alpha reductase inhibitors   Review of Systems Review of Systems   Physical Exam Triage Vital Signs ED Triage Vitals [01/27/19 1332]  Enc Vitals Group     BP 97/65     Pulse Rate 90     Resp 18     Temp 98.8 F (37.1 C)     Temp Source Oral     SpO2 99 %     Weight      Height      Head Circumference      Peak Flow      Pain  Score 4     Pain Loc      Pain Edu?      Excl. in [place]?    No data found.  Updated Vital Signs BP 97/65   Pulse 90   Temp 98.8 F (37.1 C) (Oral)   Resp 18   LMP 09/29/2018   SpO2 99%    Physical Exam Constitutional:      General: She is not in acute distress.    Appearance: She is well-developed.     Comments: Patient vague with responses at times, appears generally uncomfortable   Cardiovascular:     Rate and Rhythm: Normal rate.  Pulmonary:     Effort: Pulmonary effort is normal.  Abdominal:     Comments: Very mild low abdominal pain on palpation; denies vaginal bleeding   Genitourinary:    Comments: FHT's: 140's, regular Musculoskeletal:       Back:     Comments: Mid back tenderness on palpation as well as bilateral flank with tenderness; full ROM; strength equal bilaterally; gross sensation intact to upper extremities  Skin:    General: Skin is warm and dry.  Neurological:     Mental Status: She is alert and oriented to person, place, and time.     .  UC Treatments / Results  Labs (all labs ordered are listed, but only abnormal results are displayed) Labs Reviewed  POCT URINALYSIS DIP (DEVICE) - Abnormal; Notable for the following components:      Result Value   Bilirubin Urine SMALL (*)    Protein, ur 30 (*)    Urobilinogen, UA 2.0 (*)    All other components within normal limits    EKG   Radiology No results found.  Procedures Procedures (including critical care time)  Medications Ordered in UC Medications - No data to display  Initial Impression / Assessment and Plan / UC Course  I have reviewed the triage vital signs and the nursing notes.  Pertinent labs & imaging results that were available during my care of the patient were reviewed by me and considered in my medical decision making (see chart for details).     Afebrile, non toxic appearing. New onset of pain to back and flanks without any known injury. Denies vaginal bleeding. No abdominal pain with entire exam of fetal heart tones, with deep palpation. FHT's WNL. No vag bleed. Urine unimpressive, she does have protein to urine, noted. Will try tylenol for likely muscular strain, with strict MAU precautions discussed. Patient verbalized understanding and agreeable to plan.  Ambulatory out of clinic without difficulty.    Final Clinical Impressions(s) / UC Diagnoses   Final diagnoses:  Acute midline thoracic back pain  Bilateral flank pain     Discharge Instructions     May try tylenol as needed for pain.  Your urine looks well today.  If there is any worsening of your symptoms, they do not improve, you develop increased abdominal pain, or any vaginal bleeding, please go to the [facility] for further evaluation    ED Prescriptions    Medication Sig Dispense Auth. Provider   acetaminophen (TYLENOL) 500 MG tablet Take 1 tablet (500 mg total) by mouth every 6 (six) hours as needed. 30 tablet Zigmund Gottron, NP     Controlled Substance  Prescriptions [place] Controlled Substance Registry consulted? Not Applicable   Zigmund Gottron, NP 01/27/19 1439

## 2019-01-27 NOTE — Discharge Instructions (Addendum)
May try tylenol as needed for pain.  Your urine looks well today.  If there is any worsening of your symptoms, they do not improve, you develop increased abdominal pain, or any vaginal bleeding, please go to the [facility] for further evaluation

## 2019-01-30 ENCOUNTER — Encounter: Payer: Self-pay | Admitting: Obstetrics and Gynecology

## 2019-02-01 ENCOUNTER — Encounter: Payer: Self-pay | Admitting: Obstetrics and Gynecology

## 2019-02-01 ENCOUNTER — Ambulatory Visit (INDEPENDENT_AMBULATORY_CARE_PROVIDER_SITE_OTHER): Payer: Medicaid Other | Admitting: Obstetrics and Gynecology

## 2019-02-01 ENCOUNTER — Other Ambulatory Visit: Payer: Self-pay

## 2019-02-01 VITALS — BP 93/66 | HR 93 | Wt 184.6 lb

## 2019-02-01 DIAGNOSIS — D563 Thalassemia minor: Secondary | ICD-10-CM

## 2019-02-01 DIAGNOSIS — O99112 Other diseases of the blood and blood-forming organs and certain disorders involving the immune mechanism complicating pregnancy, second trimester: Secondary | ICD-10-CM

## 2019-02-01 DIAGNOSIS — Z3480 Encounter for supervision of other normal pregnancy, unspecified trimester: Secondary | ICD-10-CM

## 2019-02-01 DIAGNOSIS — Z3A16 16 weeks gestation of pregnancy: Secondary | ICD-10-CM

## 2019-02-01 DIAGNOSIS — O09292 Supervision of pregnancy with other poor reproductive or obstetric history, second trimester: Secondary | ICD-10-CM

## 2019-02-01 DIAGNOSIS — R8271 Bacteriuria: Secondary | ICD-10-CM | POA: Insufficient documentation

## 2019-02-01 DIAGNOSIS — O09299 Supervision of pregnancy with other poor reproductive or obstetric history, unspecified trimester: Secondary | ICD-10-CM

## 2019-02-01 DIAGNOSIS — Z8632 Personal history of gestational diabetes: Secondary | ICD-10-CM

## 2019-02-01 NOTE — Progress Notes (Signed)
   PRENATAL VISIT NOTE  Subjective:  Ariana [name] is a 33 y.o. G5P4004 at [redacted]w[redacted]d being seen today for ongoing prenatal care.  She is currently monitored for the following issues for this low-risk pregnancy and has Supervision of other normal pregnancy, antepartum; Nausea and vomiting during pregnancy; History of gestational diabetes in prior pregnancy, currently pregnant; Alpha thalassemia silent carrier; and Group B streptococcal bacteriuria on their problem list.  Patient reports no complaints. Occasional back pain. Contractions: Not present. Vag. Bleeding: None.  Movement: Absent. Denies leaking of fluid.   The following portions of the patient's history were reviewed and updated as appropriate: allergies, current medications, past family history, past medical history, past social history, past surgical history and problem list.   Objective:   Vitals:   02/01/19 1613  BP: 93/66  Pulse: 93  Weight: 184 lb 9.6 oz (83.7 kg)    Fetal Status: Fetal Heart Rate (bpm): 160   Movement: Absent     General:  Alert, oriented and cooperative. Patient is in no acute distress.  Skin: Skin is warm and dry. No rash noted.   Cardiovascular: Normal heart rate noted  Respiratory: Normal respiratory effort, no problems with respiration noted  Abdomen: Soft, gravid, appropriate for gestational age.  Pain/Pressure: Absent     Pelvic: Cervical exam deferred        Extremities: Normal range of motion.  Edema: None  Mental Status: Normal mood and affect. Normal behavior. Normal judgment and thought content.   Assessment and Plan:  Pregnancy: [id number] at [redacted]w[redacted]d  1. Supervision of other normal pregnancy, antepartum  2. History of gestational diabetes in prior pregnancy, currently pregnant A1c wnl  3. Alpha thalassemia silent carrier Reviewed with patient  Preterm labor symptoms and general obstetric precautions including but not limited to vaginal bleeding, contractions, leaking of fluid and fetal  movement were reviewed in detail with the patient. Please refer to After Visit Summary for other counseling recommendations.   Return in about 4 weeks (around 03/01/2019) for virtual, OB visit (MD).  Future Appointments  Date Time Provider [place]  02/06/2019  2:00 PM [facility]  02/06/2019  2:00 PM [facility] [place] [facility] M[facility]-US  02/06/2019  3:00 PM [place] [facility] [place] [facility]    Sloan Leiter, MD

## 2019-02-06 ENCOUNTER — Other Ambulatory Visit: Payer: Self-pay

## 2019-02-06 ENCOUNTER — Ambulatory Visit (HOSPITAL_COMMUNITY)
Admission: RE | Admit: 2019-02-06 | Discharge: 2019-02-06 | Disposition: A | Discharge status: Home / Self Care | Payer: Medicaid Other | Source: Ambulatory Visit | Attending: Obstetrics and Gynecology | Admitting: Obstetrics and Gynecology

## 2019-02-06 ENCOUNTER — Other Ambulatory Visit (HOSPITAL_COMMUNITY): Payer: Self-pay | Admitting: *Deleted

## 2019-02-06 ENCOUNTER — Ambulatory Visit (HOSPITAL_COMMUNITY): Payer: Self-pay | Admitting: Genetic Counselor

## 2019-02-06 ENCOUNTER — Ambulatory Visit (HOSPITAL_BASED_OUTPATIENT_CLINIC_OR_DEPARTMENT_OTHER): Payer: Medicaid Other | Admitting: Genetic Counselor

## 2019-02-06 ENCOUNTER — Encounter (HOSPITAL_COMMUNITY): Payer: Self-pay | Admitting: *Deleted

## 2019-02-06 ENCOUNTER — Ambulatory Visit (HOSPITAL_COMMUNITY): Payer: Medicaid Other | Admitting: *Deleted

## 2019-02-06 DIAGNOSIS — Z3480 Encounter for supervision of other normal pregnancy, unspecified trimester: Secondary | ICD-10-CM | POA: Diagnosis not present

## 2019-02-06 DIAGNOSIS — O09292 Supervision of pregnancy with other poor reproductive or obstetric history, second trimester: Secondary | ICD-10-CM

## 2019-02-06 DIAGNOSIS — D563 Thalassemia minor: Secondary | ICD-10-CM | POA: Insufficient documentation

## 2019-02-06 DIAGNOSIS — Z315 Encounter for genetic counseling: Secondary | ICD-10-CM

## 2019-02-06 DIAGNOSIS — Z3A17 17 weeks gestation of pregnancy: Secondary | ICD-10-CM | POA: Diagnosis not present

## 2019-02-06 DIAGNOSIS — O99212 Obesity complicating pregnancy, second trimester: Secondary | ICD-10-CM | POA: Diagnosis not present

## 2019-02-06 DIAGNOSIS — Z362 Encounter for other antenatal screening follow-up: Secondary | ICD-10-CM

## 2019-02-06 NOTE — Progress Notes (Signed)
02/06/2019  Ariana [name] 1986/03/31 MRN: [id number] DOV: 02/06/2019  Ariana [name] presented to the [facility] for a genetics consultation regarding her alpha-thalassemia carrier status. Ariana [name] came to her appointment alone due to COVID-19 visitor restrictions.   Indication for genetic counseling - Silent carrier for alpha-thalassemia  Prenatal history  Ariana [name] is a [id number], 33 y.o. female. Her current pregnancy has completed [redacted]w[redacted]d (Estimated Date of Delivery: 07/14/19).  Ariana [name] denied exposure to environmental toxins or chemical agents. She denied the use of alcohol, tobacco or street drugs. She denied significant viral illnesses, fevers, and bleeding during the course of her pregnancy. Her medical and surgical histories were noncontributory.  Family History  A three generation pedigree was drafted and reviewed. Both family histories were reviewed and found to be noncontributory for birth defects, intellectual disability, recurrent pregnancy loss, and known genetic conditions.    The patient's ethnicity is [place]. The father of the pregnancy's ethnicity is [place]. Ashkenazi Jewish ancestry and consanguinity were denied. Pedigree will be scanned under Media.  Discussion  [name] had Horizon 14 carrier screening performed through [place]. The results of the screen identified her as a silent carrier for alpha-thalassemia (aa/a-). Alpha-thalassemia is different in its inheritance compared to other hemoglobinopathies as there are two alpha globin genes on each chromosome 16, or four alpha globin genes total (aa/aa). A person can be a carrier of one alpha gene mutation (aa/a-), also referred to as a "silent carrier". A person who carries two alpha globin gene mutations can either carry them in cis (both on the same chromosome, denoted as aa/--) or in trans (on different chromosomes, denoted as a-/a-). Alpha-thalassemia carriers of two mutations who  have African ancestry are more likely to have a trans arrangement (a-/a-); cis configuration is reported to be rare in individuals with African ancestry.     There are several different forms of alpha-thalassemia. The most severe form of alpha-thalassemia, Hb Barts, is associated with an absence of alpha globin chain synthesis as a result of deletions of all four alpha globin genes (--/--).  Given that Ariana [name] is a silent carrier (aa/a-), her pregnancies would not be at increased risk for Hb Barts, even if her partner is a carrier for alpha-thalassemia. Hemoglobin H (HbH) disease is caused by three deleted or dysfunctioning alpha globin alleles (a-/--) and is characterized by microcytic hypochromic hemolytic anemia, hepatosplenomegaly, mild jaundice, and sometimes thalassemia-like bone changes. Given Ariana [name]'s silent carrier status (aa/a-), the fetus would only be at risk for HbH disease (a-/--), if her partner is a carrier for two alpha globin mutations in cis (aa/--). If this is the case, the risk for HbH disease in the pregnancy would be 1 in 4 (25%). However, if Ariana [name]'s partner is a carrier for two alpha globin mutations, he would be more likely to carry them in trans configuration (a-/a-) than the cis configuration (aa/--), given his ethnicity. If he is a carrier of alpha-thalassemia in trans, then the pregnancy would not be at increased risk for HbH disease. Based on the carrier frequency for alpha-thalassemia in the African population, Ariana [name]'s partner has a 1 in 30 chance of being any type of carrier for alpha-thalassemia.   Ariana [name]'s carrier screening was negative for the other 13 conditions screened. Thus, her risk to be a carrier for these additional conditions (listed separately in the laboratory report) has been reduced but not eliminated. We discussed that carrier testing for alpha-thalassemia via CBC,  hemoglobin electrophoresis, and ferritin analysis with reflex to molecular  testing is recommended for Ariana [name]'s partner. Ariana [name] indicated that she is interested in pursuing carrier screening for alpha thalassemia for her partner.  We also reviewed that Ariana [name] had Panorama NIPS through [facility] that was low-risk for fetal aneuploidies. We reviewed that these results showed a less than 1 in 10,000 risk for trisomies 21, 18 and 13, and monosomy X (Turner syndrome).  In addition, the risk for triploidy and sex chromosome trisomies (47,XXX and 47,XXY) was also low. Ariana [name] elected to have cffDNA analysis for 22q11 deletion syndrome, which was also low risk (1 in 9000). We reviewed that while this testing identifies > 99% of pregnancies with trisomy 6721, trisomy 4513, sex chromosome trisomies (47,XXX and 47,XXY), and triploidy, it is NOT diagnostic. A positive test result requires confirmation by CVS or amniocentesis, and a negative test result does not rule out a fetal chromosome abnormality. She also understands that this testing does not identify all genetic conditions.  A complete ultrasound was performed today prior to our visit. The ultrasound report will be sent under separate cover. There were no visualized fetal anomalies or markers suggestive of aneuploidy.  Ariana [name] was also counseled regarding diagnostic testing via amniocentesis. We discussed the technical aspects of the procedure and quoted up to a 1 in 500 (0.2%) risk for spontaneous pregnancy loss or other adverse pregnancy outcomes as a result of amniocentesis. Cultured cells from an amniocentesis sample allow for the visualization of a fetal karyotype, which can detect >99% of chromosomal aberrations. Chromosomal microarray can also be performed to identify smaller deletions or duplications of fetal chromosomal material. Amniocentesis could also be performed to assess whether the baby is affected by alpha-thalassemia. After careful consideration, Ariana [name] declined amniocentesis at this time. She  understands that amniocentesis is available at any point after 16 weeks of pregnancy and that she may opt to undergo the procedure at a later date should she change her mind.  Lastly, screening for open neural tube defects (ONTDs) via MS-AFP in the second trimester in addition to level II ultrasound examination is recommended. Ariana [name]'s level II ultrasound did not detect any ONTDs, and that level II ultrasound is able to detect them with 90-95% sensitivity. However, normal results from any of the above options do not guarantee a normal baby, as 3-5% of newborns have some type of birth defect, many of which are not prenatally diagnosable.  Ariana [name] desires partner carrier screening for alpha-thalassemia. She indicated that her partner would be able to come in to have his blood drawn for carrier screening. I provided her with my business card and encouraged her partner, Sherren Mochabraham Heacock, to call in and schedule a lab appointment for his blood draw. Results will take 2-5 days from the date of blood draw to be returned. I will call Ms. Woodin with the results when they become available.  I counseled Ms. Marzette regarding the above risks and available options. The approximate face-to-face time with the genetic counselor was 30 minutes.  In summary:  Discussed carrier screening resultsand options for follow-up testing  Silent carrier for alpha-thalassemia  Desires partner carrier screening. Partner, Sherren MochaAbraham Frei, will call to set up lab appointment for blood draw. We will follow results  Reviewed negative NIPS results  Reduction in risk for Down syndrome, trisomy 6318, trisomy 4013, sex chromosome aneuploidies, and 22q11.2 deletion syndrome  Reviewed results of ultrasound  No fetal anomalies or markers seen  Reduction  in risk for fetal aneuploidy  Offered additional testing and screening  Declined amniocentesis  Recommend MS-AFP screening  Reviewed family history concerns   Gershon CraneHaley  E Brecklyn Galvis, MS Genetic Counselor

## 2019-02-16 ENCOUNTER — Encounter (HOSPITAL_COMMUNITY): Payer: Self-pay | Admitting: *Deleted

## 2019-02-16 ENCOUNTER — Other Ambulatory Visit: Payer: Self-pay

## 2019-02-16 ENCOUNTER — Inpatient Hospital Stay (HOSPITAL_COMMUNITY)
Admission: EM | Admit: 2019-02-16 | Discharge: 2019-02-16 | Disposition: A | Discharge status: Home / Self Care | Payer: Medicaid Other | Attending: Family Medicine | Admitting: Family Medicine

## 2019-02-16 DIAGNOSIS — O26892 Other specified pregnancy related conditions, second trimester: Secondary | ICD-10-CM | POA: Insufficient documentation

## 2019-02-16 DIAGNOSIS — R109 Unspecified abdominal pain: Secondary | ICD-10-CM | POA: Insufficient documentation

## 2019-02-16 DIAGNOSIS — Z3A18 18 weeks gestation of pregnancy: Secondary | ICD-10-CM | POA: Insufficient documentation

## 2019-02-16 LAB — URINALYSIS, ROUTINE W REFLEX MICROSCOPIC
Bilirubin Urine: NEGATIVE
Glucose, UA: NEGATIVE mg/dL
Hgb urine dipstick: NEGATIVE
Ketones, ur: NEGATIVE mg/dL
Leukocytes,Ua: NEGATIVE
Nitrite: NEGATIVE
Protein, ur: NEGATIVE mg/dL
Specific Gravity, Urine: 1.029 (ref 1.005–1.030)
pH: 5 (ref 5.0–8.0)

## 2019-02-16 LAB — POC URINE PREG, ED: Preg Test, Ur: POSITIVE — AB

## 2019-02-16 NOTE — MAU Note (Signed)
Presents with abdominal pain that began @ 0530 this morning.  Denies VB or LOF.

## 2019-02-16 NOTE — MAU Provider Note (Signed)
Chief Complaint: Abdominal Pain   First Provider Initiated Contact with Patient 02/16/19 0933      SUBJECTIVE HPI: Ariana [name] is a 33 y.o. G5P4004 at [redacted]w[redacted]d who presents to maternity admissions reporting onset of severe generalized abdominal pain at 5 am this morning. She had a bowel movement after the pain started and pain has gradually resolved by her arrival in MAU. She denies any diarrhea or constipation.  She denies pain currently.  She has not tried any other treatments. There are no associated symptoms.  She feels normal fetal movement and denies vaginal bleeding, vaginal itching/burning, urinary symptoms, h/a, dizziness, n/v, or fever/chills.     HPI  Past Medical History:  Diagnosis Date  . Alpha thalassemia silent carrier   . Gestational diabetes    History reviewed. No pertinent surgical history. Social History   Socioeconomic History  . Marital status: Married    Spouse name: Not on file  . Number of children: Not on file  . Years of education: Not on file  . Highest education level: Not on file  Occupational History  . Not on file  Social Needs  . Financial resource strain: Not on file  . Food insecurity    Worry: Not on file    Inability: Not on file  . Transportation needs    Medical: Not on file    Non-medical: Not on file  Tobacco Use  . Smoking status: Never Smoker  . Smokeless tobacco: Never Used  Substance and Sexual Activity  . Alcohol use: No  . Drug use: No  . Sexual activity: Yes    Birth control/protection: None  Lifestyle  . Physical activity    Days per week: Not on file    Minutes per session: Not on file  . Stress: Not on file  Relationships  . Social Musician on phone: Not on file    Gets together: Not on file    Attends religious service: Not on file    Active member of club or organization: Not on file    Attends meetings of clubs or organizations: Not on file    Relationship status: Not on file  . Intimate partner  violence    Fear of current or ex partner: Not on file    Emotionally abused: Not on file    Physically abused: Not on file    Forced sexual activity: Not on file  Other Topics Concern  . Not on file  Social History Narrative  . Not on file   No current facility-administered medications on file prior to encounter.    Current Outpatient Medications on File Prior to Encounter  Medication Sig Dispense Refill  . glycopyrrolate (ROBINUL) 2 MG tablet Take 1 tablet (2 mg total) by mouth 3 (three) times daily as needed. 30 tablet 3  . ondansetron (ZOFRAN ODT) 4 MG disintegrating tablet Take 1 tablet (4 mg total) by mouth every 6 (six) hours as needed for nausea. 20 tablet 3  . acetaminophen (TYLENOL) 500 MG tablet Take 1 tablet (500 mg total) by mouth every 6 (six) hours as needed. 30 tablet 0  . clindamycin (CLEOCIN) 300 MG capsule Take 1 capsule (300 mg total) by mouth 3 (three) times daily. (Patient not taking: Reported on 02/01/2019) 21 capsule 0  . Prenat-FeAsp-Meth-FA-DHA w/o A (PRENATE PIXIE) 10-0.6-0.4-200 MG CAPS Take 1 capsule by mouth daily. 30 capsule 11   Allergies  Allergen Reactions  . 5-Alpha Reductase Inhibitors  ROS:  Review of Systems  Constitutional: Negative for chills, fatigue and fever.  Respiratory: Negative for shortness of breath.   Cardiovascular: Negative for chest pain.  Gastrointestinal: Positive for abdominal pain. Negative for nausea and vomiting.  Genitourinary: Negative for difficulty urinating, dysuria, flank pain, pelvic pain, vaginal bleeding, vaginal discharge and vaginal pain.  Neurological: Negative for dizziness and headaches.  Psychiatric/Behavioral: Negative.      I have reviewed patient's Past Medical Hx, Surgical Hx, Family Hx, Social Hx, medications and allergies.   Physical Exam   Patient Vitals for the past 24 hrs:  BP Temp Temp src Pulse Resp SpO2 Weight  02/16/19 1012 100/64 98.7 F (37.1 C) Oral 84 18 100 % -  02/16/19 0913  102/69 98.4 F (36.9 C) Oral 76 20 100 % 84.3 kg  02/16/19 0746 97/74 98.9 F (37.2 C) Oral 89 18 99 % -   Constitutional: Well-developed, well-nourished female in no acute distress.  Cardiovascular: normal rate Respiratory: normal effort GI: Abd soft, non-tender. Pos BS x 4 MS: Extremities nontender, no edema, normal ROM Neurologic: Alert and oriented x 4.  GU: Neg CVAT.  Dilation: Closed Effacement (%): Thick Exam by:: L. Leftwich-Kirby, CNM   FHT 158 by doppler  LAB RESULTS Results for orders placed or performed during the hospital encounter of 02/16/19 (from the past 24 hour(s))  POC Urine Pregnancy, ED (not at [facility])     Status: Abnormal   Collection Time: 02/16/19  8:00 AM  Result Value Ref Range   Preg Test, Ur POSITIVE (A) NEGATIVE  Urinalysis, Routine w reflex microscopic     Status: Abnormal   Collection Time: 02/16/19  9:29 AM  Result Value Ref Range   Color, Urine AMBER (A) YELLOW   APPearance HAZY (A) CLEAR   Specific Gravity, Urine 1.029 1.005 - 1.030   pH 5.0 5.0 - 8.0   Glucose, UA NEGATIVE NEGATIVE mg/dL   Hgb urine dipstick NEGATIVE NEGATIVE   Bilirubin Urine NEGATIVE NEGATIVE   Ketones, ur NEGATIVE NEGATIVE mg/dL   Protein, ur NEGATIVE NEGATIVE mg/dL   Nitrite NEGATIVE NEGATIVE   Leukocytes,Ua NEGATIVE NEGATIVE    O/Positive/-- (07/24 0932)  IMAGING   MAU Management/MDM: Orders Placed This Encounter  Procedures  . Urinalysis, Routine w reflex microscopic  . POC Urine Pregnancy, ED (not at [facility])  . Discharge patient    No orders of the defined types were placed in this encounter.   No acute abdomen on exam.  With pain resolving gradually after bowel movement, likely pain related to digestion.  No reported constipation or diarrhea.  Pt without pain in MAU. Cervix closed so no evidence of labor and FHT wnl. Return precautions reviewed.  Pt to keep appt in office as scheduled..    ASSESSMENT 1. Abdominal pain during pregnancy in second trimester      PLAN Discharge home Allergies as of 02/16/2019      Reactions   5-alpha Reductase Inhibitors       Medication List    TAKE these medications   acetaminophen 500 MG tablet Commonly known as: TYLENOL Take 1 tablet (500 mg total) by mouth every 6 (six) hours as needed.   clindamycin 300 MG capsule Commonly known as: Cleocin Take 1 capsule (300 mg total) by mouth 3 (three) times daily.   glycopyrrolate 2 MG tablet Commonly known as: ROBINUL Take 1 tablet (2 mg total) by mouth 3 (three) times daily as needed.   ondansetron 4 MG disintegrating tablet Commonly known as:  Zofran ODT Take 1 tablet (4 mg total) by mouth every 6 (six) hours as needed for nausea.   Prenate Pixie 10-0.6-0.4-200 MG Caps Take 1 capsule by mouth daily.      Follow-up Information    [facility] up.   Why: As scheduled, return to MAU as needed for worsening pain or emergencies. Contact information: [address] [place] [phone]          Sharen CounterLisa Leftwich-Kirby Certified Nurse-Midwife 02/16/2019  10:31 AM

## 2019-02-21 ENCOUNTER — Other Ambulatory Visit: Payer: Self-pay | Admitting: Obstetrics and Gynecology

## 2019-02-28 ENCOUNTER — Encounter: Payer: Medicaid Other | Admitting: Obstetrics and Gynecology

## 2019-03-08 ENCOUNTER — Other Ambulatory Visit: Payer: Self-pay

## 2019-03-08 ENCOUNTER — Ambulatory Visit (HOSPITAL_COMMUNITY): Payer: Medicaid Other | Admitting: *Deleted

## 2019-03-08 ENCOUNTER — Ambulatory Visit (HOSPITAL_COMMUNITY)
Admission: RE | Admit: 2019-03-08 | Discharge: 2019-03-08 | Disposition: A | Discharge status: Home / Self Care | Payer: Medicaid Other | Source: Ambulatory Visit | Attending: Obstetrics and Gynecology | Admitting: Obstetrics and Gynecology

## 2019-03-08 ENCOUNTER — Encounter (HOSPITAL_COMMUNITY): Payer: Self-pay

## 2019-03-08 DIAGNOSIS — O99212 Obesity complicating pregnancy, second trimester: Secondary | ICD-10-CM | POA: Diagnosis not present

## 2019-03-08 DIAGNOSIS — Z3480 Encounter for supervision of other normal pregnancy, unspecified trimester: Secondary | ICD-10-CM

## 2019-03-08 DIAGNOSIS — Z362 Encounter for other antenatal screening follow-up: Secondary | ICD-10-CM | POA: Diagnosis not present

## 2019-03-08 DIAGNOSIS — Z3A21 21 weeks gestation of pregnancy: Secondary | ICD-10-CM | POA: Diagnosis not present

## 2019-03-08 DIAGNOSIS — Z148 Genetic carrier of other disease: Secondary | ICD-10-CM

## 2019-03-08 DIAGNOSIS — O09292 Supervision of pregnancy with other poor reproductive or obstetric history, second trimester: Secondary | ICD-10-CM

## 2019-03-09 ENCOUNTER — Other Ambulatory Visit (HOSPITAL_COMMUNITY): Payer: Self-pay | Admitting: *Deleted

## 2019-03-09 DIAGNOSIS — Z362 Encounter for other antenatal screening follow-up: Secondary | ICD-10-CM

## 2019-03-14 ENCOUNTER — Telehealth: Payer: Self-pay | Admitting: Obstetrics and Gynecology

## 2019-04-05 ENCOUNTER — Encounter (HOSPITAL_COMMUNITY): Payer: Self-pay

## 2019-04-05 ENCOUNTER — Other Ambulatory Visit: Payer: Self-pay

## 2019-04-05 ENCOUNTER — Ambulatory Visit (HOSPITAL_COMMUNITY)
Admission: RE | Admit: 2019-04-05 | Discharge: 2019-04-05 | Disposition: A | Discharge status: Home / Self Care | Payer: Medicaid Other | Source: Ambulatory Visit | Attending: Maternal & Fetal Medicine | Admitting: Maternal & Fetal Medicine

## 2019-04-05 ENCOUNTER — Ambulatory Visit (HOSPITAL_COMMUNITY): Payer: Medicaid Other | Admitting: *Deleted

## 2019-04-05 DIAGNOSIS — Z3480 Encounter for supervision of other normal pregnancy, unspecified trimester: Secondary | ICD-10-CM | POA: Diagnosis not present

## 2019-04-05 DIAGNOSIS — Z3A25 25 weeks gestation of pregnancy: Secondary | ICD-10-CM

## 2019-04-05 DIAGNOSIS — O99212 Obesity complicating pregnancy, second trimester: Secondary | ICD-10-CM | POA: Diagnosis not present

## 2019-04-05 DIAGNOSIS — O09292 Supervision of pregnancy with other poor reproductive or obstetric history, second trimester: Secondary | ICD-10-CM | POA: Diagnosis not present

## 2019-04-05 DIAGNOSIS — Z362 Encounter for other antenatal screening follow-up: Secondary | ICD-10-CM | POA: Insufficient documentation

## 2019-04-06 ENCOUNTER — Encounter: Payer: Medicaid Other | Admitting: Obstetrics and Gynecology

## 2019-04-09 ENCOUNTER — Other Ambulatory Visit: Payer: Self-pay | Admitting: Obstetrics and Gynecology

## 2019-04-09 DIAGNOSIS — O219 Vomiting of pregnancy, unspecified: Secondary | ICD-10-CM

## 2019-04-11 ENCOUNTER — Ambulatory Visit (INDEPENDENT_AMBULATORY_CARE_PROVIDER_SITE_OTHER): Payer: Medicaid Other | Admitting: Advanced Practice Midwife

## 2019-04-11 DIAGNOSIS — Z3480 Encounter for supervision of other normal pregnancy, unspecified trimester: Secondary | ICD-10-CM

## 2019-04-11 DIAGNOSIS — Z3A26 26 weeks gestation of pregnancy: Secondary | ICD-10-CM

## 2019-04-11 DIAGNOSIS — O219 Vomiting of pregnancy, unspecified: Secondary | ICD-10-CM

## 2019-04-11 DIAGNOSIS — O99891 Other specified diseases and conditions complicating pregnancy: Secondary | ICD-10-CM

## 2019-04-11 DIAGNOSIS — M549 Dorsalgia, unspecified: Secondary | ICD-10-CM

## 2019-04-11 MED ORDER — GLYCOPYRROLATE 2 MG PO TABS: 2.0000 mg | ORAL_TABLET | Freq: Three times a day (TID) | ORAL | 3 refills | Status: DC | PRN

## 2019-04-11 MED ORDER — COMFORT FIT MATERNITY SUPP MED MISC: 1.0000 | Freq: Every day | 0 refills | Status: DC

## 2019-04-11 MED ORDER — BLOOD PRESSURE MONITOR KIT: 1.0000 | PACK | Freq: Once | 0 refills | Status: AC

## 2019-04-11 NOTE — Progress Notes (Signed)
Pt is unable to check BP today- does not have cuff.  Was reordered to [employer] today. Pt needs refill on Robinul.

## 2019-04-11 NOTE — Progress Notes (Signed)
 TELEHEALTH OBSTETRICS PRENATAL VIRTUAL VIDEO VISIT ENCOUNTER NOTE  Provider location: [facility]   I connected with Ariana [name] on 04/11/19 at  3:35 PM EDT by WebEx Video Encounter at home and verified that I am speaking with the correct person using two identifiers.   I discussed the limitations, risks, security and privacy concerns of performing an evaluation and management service virtually and the availability of in person appointments. I also discussed with the patient that there may be a patient responsible charge related to this service. The patient expressed understanding and agreed to proceed. Subjective:  Ariana [name] is a 32 y.o. G5P4004 at [redacted]w[redacted]d being seen today for ongoing prenatal care.  She is currently monitored for the following issues for this low-risk pregnancy and has Supervision of other normal pregnancy, antepartum; Nausea and vomiting during pregnancy; History of gestational diabetes in prior pregnancy, currently pregnant; Alpha thalassemia silent carrier; and Group B streptococcal bacteriuria on their problem list.  Patient reports backache.  Contractions: Not present. Vag. Bleeding: None.  Movement: Present. Denies any leaking of fluid.   The following portions of the patient's history were reviewed and updated as appropriate: allergies, current medications, past family history, past medical history, past social history, past surgical history and problem list.   Objective:  There were no vitals filed for this visit.  Fetal Status:     Movement: Present     General:  Alert, oriented and cooperative. Patient is in no acute distress.  Respiratory: Normal respiratory effort, no problems with respiration noted  Mental Status: Normal mood and affect. Normal behavior. Normal judgment and thought content.  Rest of physical exam deferred due to type of encounter  Imaging: Us Mfm Ob Follow Up  Result Date: 04/05/2019  ----------------------------------------------------------------------  OBSTETRICS REPORT                       (Signed Final 04/05/2019 05:02 pm) ---------------------------------------------------------------------- Patient Info  ID #:       [id number]                          D.O.B.:  08/02/1985 (32 yrs)  Name:       Ariana [name]                    Visit Date: 04/05/2019 02:40 pm ---------------------------------------------------------------------- Performed By  Performed By:     Carrie Stalter BS      Ref. Address:     [address]                    [address]                                                             [address]                                                             [address]                                                               [place]                                                             [id number]  Attending:        Victor Fang MD         [facility]  Referred By:      [facility] ---------------------------------------------------------------------- Orders   #  Description                          Code         Ordered By   1  US MFM OB FOLLOW UP                  76816.01     CORENTHIAN                                                        BOOKER  ----------------------------------------------------------------------   #  Order #                    Accession #                 Episode #   1  [id number]                  [id number]                  [id number]  ---------------------------------------------------------------------- Indications   Obesity complicating pregnancy, second         O99.212   trimester (pregravid BMI 34.31)   Genetic carrier (silent carrier for alpha thal)Z14.8   Poor obstetric history: Previous gestational   O09.299   diabetes   Low Risk NIPS   Encounter for other antenatal screening        Z36.2   follow-up   [redacted] weeks gestation of pregnancy                Z3A.25   ---------------------------------------------------------------------- Fetal Evaluation  Num Of Fetuses:         1  Fetal Heart Rate(bpm):  154  Cardiac Activity:       Observed  Presentation:           Breech  Placenta:               Posterior  P. Cord Insertion:      Visualized  Amniotic Fluid  AFI FV:      Within normal limits                              Largest Pocket(cm)                                5.15 ---------------------------------------------------------------------- Biometry  BPD:      59.8  mm     G. Age:  24w 3d          7  %    CI:        65.88   %    70 - 86                                                          FL/HC:      19.5   %    18.6 - 20.4  HC:      236.4  mm     G. Age:  25w 5d         25  %    HC/AC:      1.13        1.04 - 1.22  AC:      208.9  mm     G. Age:  25w 3d         16  %    FL/BPD:     76.9   %    71 - 87  FL:         46  mm     G. Age:  25w 2d         23  %    FL/AC:      22.0   %    20 - 24  HUM:      42.1  mm     G. Age:  25w 2d         35  %  Est. FW:     799  gm    1 lb 12 oz      25  % ---------------------------------------------------------------------- OB History  Gravidity:    5         Term:   4        Prem:   0        SAB:   0  TOP:          0       Ectopic:  0        Living: 4 ---------------------------------------------------------------------- Gestational Age  U/S Today:     25w 2d                                        EDD:   07/17/19  Best:          25w 5d     Det. ByLoman Chroman         EDD:   07/14/19                                      (11/21/18) ---------------------------------------------------------------------- Anatomy  Cranium:               Appears normal         Aortic Arch:            Appears normal  Cavum:                 Appears normal  Ductal Arch:            Appears normal  Ventricles:            Appears normal         Diaphragm:              Appears normal  Choroid Plexus:        Appears normal         Stomach:                 Appears normal, left                                                                        sided  Cerebellum:            Appears normal         Abdomen:                Appears normal  Posterior Fossa:       Appears normal         Abdominal Wall:         Previously seen  Nuchal Fold:           Previously seen        Cord Vessels:           Previously seen  Face:                  Appears normal         Kidneys:                Appear normal                         (orbits and profile)  Lips:                  Appears normal         Bladder:                Appears normal  Thoracic:              Appears normal         Spine:                  Previously seen  Heart:                 Appears normal         Upper Extremities:      Previously seen                         (4CH, axis, and                         situs)  RVOT:                  Previously seen        Lower Extremities:      Previously seen  LVOT:                  Appears normal  Other:  Heels, 5th digit, and nasal bone previously visualized. Technically            difficult due to fetal position. 3VV and 3VTV visualized. ---------------------------------------------------------------------- Cervix Uterus Adnexa  Cervix  Not visualized (advanced GA >24wks)  Uterus  No abnormality visualized.  Left Ovary  Within normal limits.  Right Ovary  Within normal limits.  Cul De Sac  No free fluid seen.  Adnexa  No abnormality visualized. ---------------------------------------------------------------------- Comments  The fetal growth and amniotic fluid level appears appropriate  for her gestational age.  The four-chamber view of the fetal heart was visualized today.  Follow-up as indicated. ----------------------------------------------------------------------                   Victor Fang, MD Electronically Signed Final Report   04/05/2019 05:02 pm ----------------------------------------------------------------------   Assessment and Plan:  Pregnancy: G5P4004 at [redacted]w[redacted]d  1. Supervision of other normal pregnancy, antepartum --Anticipatory guidance about next visits/weeks of pregnancy given. --Reviewed normal follow up US results - Blood Pressure Monitor KIT; 1 kit by Does not apply route once for 1 dose.  Dispense: 1 kit; Refill: 0  2. Nausea and vomiting during pregnancy --Ptyalism, robinul helps. Refill Rx. - glycopyrrolate (ROBINUL) 2 MG tablet; Take 1 tablet (2 mg total) by mouth 3 (three) times daily as needed.  Dispense: 90 tablet; Refill: 3  3. Back pain complicating pregnancy in second trimester --Intermittent, with movement.  --Rest/ice/heat/warm bath/Tylenol/pregnancy support belt - Elastic Bandages & Supports (COMFORT FIT MATERNITY SUPP MED) MISC; 1 Device by Does not apply route daily.  Dispense: 1 each; Refill: 0  Preterm labor symptoms and general obstetric precautions including but not limited to vaginal bleeding, contractions, leaking of fluid and fetal movement were reviewed in detail with the patient. I discussed the assessment and treatment plan with the patient. The patient was provided an opportunity to ask questions and all were answered. The patient agreed with the plan and demonstrated an understanding of the instructions. The patient was advised to call back or seek an in-person office evaluation/go [facility] for any urgent or concerning symptoms. Please refer to After Visit Summary for other counseling recommendations.   I provided 10 minutes of face-to-face time during this encounter.  Return in about 2 weeks (around 04/25/2019).  No future appointments.  Lisa Leftwich-Kirby, CNM [facility], [facility]  

## 2019-04-21 DIAGNOSIS — Z3480 Encounter for supervision of other normal pregnancy, unspecified trimester: Secondary | ICD-10-CM | POA: Diagnosis not present

## 2019-04-25 ENCOUNTER — Ambulatory Visit (INDEPENDENT_AMBULATORY_CARE_PROVIDER_SITE_OTHER): Payer: Medicaid Other | Admitting: Obstetrics and Gynecology

## 2019-04-25 ENCOUNTER — Other Ambulatory Visit: Payer: Medicaid Other

## 2019-04-25 ENCOUNTER — Other Ambulatory Visit: Payer: Self-pay

## 2019-04-25 ENCOUNTER — Encounter: Payer: Self-pay | Admitting: Obstetrics

## 2019-04-25 VITALS — BP 103/69 | HR 94 | Wt 199.1 lb

## 2019-04-25 DIAGNOSIS — O26893 Other specified pregnancy related conditions, third trimester: Secondary | ICD-10-CM

## 2019-04-25 DIAGNOSIS — M545 Low back pain, unspecified: Secondary | ICD-10-CM

## 2019-04-25 DIAGNOSIS — Z23 Encounter for immunization: Secondary | ICD-10-CM

## 2019-04-25 DIAGNOSIS — R8271 Bacteriuria: Secondary | ICD-10-CM

## 2019-04-25 DIAGNOSIS — Z3480 Encounter for supervision of other normal pregnancy, unspecified trimester: Secondary | ICD-10-CM | POA: Diagnosis not present

## 2019-04-25 DIAGNOSIS — Z3A28 28 weeks gestation of pregnancy: Secondary | ICD-10-CM

## 2019-04-25 DIAGNOSIS — O09293 Supervision of pregnancy with other poor reproductive or obstetric history, third trimester: Secondary | ICD-10-CM

## 2019-04-25 DIAGNOSIS — O09299 Supervision of pregnancy with other poor reproductive or obstetric history, unspecified trimester: Secondary | ICD-10-CM

## 2019-04-25 DIAGNOSIS — O99820 Streptococcus B carrier state complicating pregnancy: Secondary | ICD-10-CM

## 2019-04-25 MED ORDER — MISC. DEVICES MISC: 0 refills | Status: DC

## 2019-04-25 NOTE — Progress Notes (Signed)
Pt is here for ROB and 2 hour GTT. [redacted]w[redacted]d.

## 2019-04-25 NOTE — Progress Notes (Signed)
   PRENATAL VISIT NOTE  Subjective:  Ariana [name] is a 33 y.o. T6A2633 at [redacted]w[redacted]d being seen today for ongoing prenatal care.  She is currently monitored for the following issues for this high-risk pregnancy and has Supervision of other normal pregnancy, antepartum; Nausea and vomiting during pregnancy; History of gestational diabetes in prior pregnancy, currently pregnant; Alpha thalassemia silent carrier; and Group B streptococcal bacteriuria on their problem list.  Patient reports backache. 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 Sitting makes it better.   Contractions: Not present. Vag. Bleeding: None.  Movement: Present. Denies leaking of fluid.   The following portions of the patient's history were reviewed and updated as appropriate: allergies, current medications, past family history, past medical history, past social history, past surgical history and problem list.   Objective:   Vitals:   04/25/19 0839  BP: 103/69  Pulse: 94  Weight: 199 lb 1.6 oz (90.3 kg)    Fetal Status: Fetal Heart Rate (bpm): 155   Movement: Present     General:  Alert, oriented and cooperative. Patient is in no acute distress.  Skin: Skin is warm and dry. No rash noted.   Cardiovascular: Normal heart rate noted  Respiratory: Normal respiratory effort, no problems with respiration noted  Abdomen: Soft, gravid, appropriate for gestational age.  Pain/Pressure: Absent     Pelvic: Cervical exam deferred        Extremities: Normal range of motion.  Edema: None  Mental Status: Normal mood and affect. Normal behavior. Normal judgment and thought content.   Assessment and Plan:  Pregnancy: H5K5625 at [redacted]w[redacted]d 1. Supervision of other normal pregnancy, antepartum Tdap today Flu today - Glucose Tolerance, 2 Hours w/1 Hour - CBC - HIV antibody - RPR  2. Group B streptococcal bacteriuria ppx in labor  3. History of gestational diabetes in prior pregnancy, currently pregnant  4. Acute  bilateral low back pain without sciatica Support belt prescription given Work restrictions letter given - Ambulatory referral to Physical Therapy   Preterm labor symptoms and general obstetric precautions including but not limited to vaginal bleeding, contractions, leaking of fluid and fetal movement were reviewed in detail with the patient. Please refer to After Visit Summary for other counseling recommendations.   Return in about 2 weeks (around 05/09/2019) for high OB, virtual.  Future Appointments  Date Time Provider [place]  04/25/2019  9:30 AM Sloan Leiter, MD [facility] None    Sloan Leiter, MD

## 2019-04-26 LAB — CBC
Hematocrit: 31.4 % — ABNORMAL LOW (ref 34.0–46.6)
Hemoglobin: 10 g/dL — ABNORMAL LOW (ref 11.1–15.9)
MCH: 29.5 pg (ref 26.6–33.0)
MCHC: 31.8 g/dL (ref 31.5–35.7)
MCV: 93 fL (ref 79–97)
Platelets: 202 10*3/uL (ref 150–450)
RBC: 3.39 x10E6/uL — ABNORMAL LOW (ref 3.77–5.28)
RDW: 12.3 % (ref 11.7–15.4)
WBC: 7 10*3/uL (ref 3.4–10.8)

## 2019-04-26 LAB — HIV ANTIBODY (ROUTINE TESTING W REFLEX): HIV Screen 4th Generation wRfx: NONREACTIVE

## 2019-04-26 LAB — GLUCOSE TOLERANCE, 2 HOURS W/ 1HR
Glucose, 1 hour: 146 mg/dL (ref 65–179)
Glucose, 2 hour: 102 mg/dL (ref 65–152)
Glucose, Fasting: 86 mg/dL (ref 65–91)

## 2019-04-26 LAB — RPR: RPR Ser Ql: NONREACTIVE

## 2019-05-09 ENCOUNTER — Encounter: Payer: Medicaid Other | Admitting: Obstetrics and Gynecology

## 2019-05-10 ENCOUNTER — Ambulatory Visit (INDEPENDENT_AMBULATORY_CARE_PROVIDER_SITE_OTHER): Payer: Medicaid Other

## 2019-05-10 DIAGNOSIS — Z3A30 30 weeks gestation of pregnancy: Secondary | ICD-10-CM | POA: Diagnosis not present

## 2019-05-10 DIAGNOSIS — O26893 Other specified pregnancy related conditions, third trimester: Secondary | ICD-10-CM

## 2019-05-10 DIAGNOSIS — Z3480 Encounter for supervision of other normal pregnancy, unspecified trimester: Secondary | ICD-10-CM

## 2019-05-10 DIAGNOSIS — N898 Other specified noninflammatory disorders of vagina: Secondary | ICD-10-CM

## 2019-05-10 NOTE — Progress Notes (Signed)
   TELEHEALTH VIRTUAL OBSTETRICS VISIT ENCOUNTER NOTE  I connected with Ariana [name] on 05/10/19 at  4:15 PM EST by telephone at home and verified that I am speaking with the correct person using two identifiers.   I discussed the limitations, risks, security and privacy concerns of performing an evaluation and management service by telephone and the availability of in person appointments. I also discussed with the patient that there may be a patient responsible charge related to this service. The patient expressed understanding and agreed to proceed.  Subjective:  Ariana [name] is a 33 y.o. G5P4004 at [redacted]w[redacted]d being followed for ongoing prenatal care.  She is currently monitored for the following issues for this high-risk pregnancy and has Supervision of other normal pregnancy, antepartum; Nausea and vomiting during pregnancy; History of gestational diabetes in prior pregnancy, currently pregnant; Alpha thalassemia silent carrier; and Group B streptococcal bacteriuria on their problem list.  Patient reports vaginal irritation and itching that started last week. She denies usage of any new products.  She reports vaginal discharge that is brown and smells "fresh."  She denies vaginal pain or burning as well as sexual activity in the last 3 days.  She states that "when I itch the water comes out." She reports that she thinks she has an infection as she was treated for one before.  She reports fetal movement and denies contractions.  The following portions of the patient's history were reviewed and updated as appropriate: allergies, current medications, past family history, past medical history, past social history, past surgical history and problem list.   Objective:   General:  Alert, oriented and cooperative.   Mental Status: Normal mood and affect perceived. Normal judgment and thought content.  Rest of physical exam deferred due to type of encounter  Assessment and Plan:  Pregnancy: G5P4004 at  [redacted]w[redacted]d  1. Supervision of other normal pregnancy, antepartum -Anticipatory guidance for upcoming appt -Informed that next visit will be in 2 weeks in the office.  2. Vaginal itching -Discussed that vaginal itching is sometimes related to infectious process. -However, further evaluation necessary.  3. Vaginal discharge during pregnancy in third trimester -Discussed that brown vaginal discharge and "leaking" is of concern. -Advised that patient report to MAU for evaluation tonight. -Patient states that she will not go tonight, but will go tomorrow. Juel Burrow, NP contacted and informed of patient's anticipated arrival tomorrow-Note made.  Preterm labor symptoms and general obstetric precautions including but not limited to vaginal bleeding, contractions, leaking of fluid and fetal movement were reviewed in detail with the patient.  I discussed the assessment and treatment plan with the patient. The patient was provided an opportunity to ask questions and all were answered. The patient agreed with the plan and demonstrated an understanding of the instructions. The patient was advised to call back or seek an in-person office evaluation/go to [facility] at [facility] for any urgent or concerning symptoms. Please refer to After Visit Summary for other counseling recommendations.   I provided 9 minutes of non-face-to-face time during this encounter.  No follow-ups on file.  Future Appointments  Date Time Provider [place]  05/10/2019  4:15 PM Gavin Pound, CNM [place] [facility] [employer] [facility], [place]

## 2019-05-10 NOTE — Patient Instructions (Signed)

## 2019-05-10 NOTE — Progress Notes (Signed)
Pt is on the phone preparing for virtual visit with provider. [redacted]w[redacted]d. Pt reports she does not have a BP cuff, she denies any HA's or blurry vision.

## 2019-05-20 DIAGNOSIS — Z3480 Encounter for supervision of other normal pregnancy, unspecified trimester: Secondary | ICD-10-CM | POA: Diagnosis not present

## 2019-05-24 ENCOUNTER — Other Ambulatory Visit: Payer: Self-pay

## 2019-05-24 ENCOUNTER — Other Ambulatory Visit (HOSPITAL_COMMUNITY)
Admission: RE | Admit: 2019-05-24 | Discharge: 2019-05-24 | Disposition: A | Discharge status: Home / Self Care | Payer: Medicaid Other | Source: Ambulatory Visit | Attending: Obstetrics and Gynecology | Admitting: Obstetrics and Gynecology

## 2019-05-24 ENCOUNTER — Ambulatory Visit (INDEPENDENT_AMBULATORY_CARE_PROVIDER_SITE_OTHER): Payer: Medicaid Other | Admitting: Obstetrics and Gynecology

## 2019-05-24 ENCOUNTER — Encounter: Payer: Self-pay | Admitting: Obstetrics and Gynecology

## 2019-05-24 VITALS — BP 113/77 | HR 111 | Wt 202.2 lb

## 2019-05-24 DIAGNOSIS — N898 Other specified noninflammatory disorders of vagina: Secondary | ICD-10-CM | POA: Diagnosis not present

## 2019-05-24 DIAGNOSIS — O26893 Other specified pregnancy related conditions, third trimester: Secondary | ICD-10-CM | POA: Diagnosis not present

## 2019-05-24 DIAGNOSIS — R8271 Bacteriuria: Secondary | ICD-10-CM

## 2019-05-24 DIAGNOSIS — Z3A32 32 weeks gestation of pregnancy: Secondary | ICD-10-CM

## 2019-05-24 DIAGNOSIS — Z3480 Encounter for supervision of other normal pregnancy, unspecified trimester: Secondary | ICD-10-CM

## 2019-05-24 MED ORDER — PRENATE PIXIE 10-0.6-0.4-200 MG PO CAPS: 1.0000 | ORAL_CAPSULE | Freq: Every day | ORAL | 11 refills | Status: DC

## 2019-05-24 MED ORDER — CLOTRIMAZOLE 1 % VA CREA: 1.0000 | TOPICAL_CREAM | Freq: Every day | VAGINAL | 0 refills | Status: AC

## 2019-05-24 NOTE — Progress Notes (Signed)
Refill on prenatal  

## 2019-05-25 NOTE — Progress Notes (Signed)
   LOW-RISK PREGNANCY OFFICE VISIT Patient name: Ariana [name] MRN [id number]  Date of birth: 03-28-1986 Chief Complaint:   Routine Prenatal Visit  History of Present Illness:   Ariana [name] is a 33 y.o. G61P4004 female at [redacted]w[redacted]d with an Estimated Date of Delivery: 07/14/19 being seen today for ongoing management of a low-risk pregnancy.  Today she reports vaginal irritation. Contractions: Not present.  .  Movement: Present. denies leaking of fluid. Review of Systems:   Pertinent items are noted in HPI Denies abnormal vaginal discharge w/ itching/odor/irritation, headaches, visual changes, shortness of breath, chest pain, abdominal pain, severe nausea/vomiting, or problems with urination or bowel movements unless otherwise stated above. Pertinent History Reviewed:  Reviewed past medical,surgical, social, obstetrical and family history.  Reviewed problem list, medications and allergies. Physical Assessment:   Vitals:   05/24/19 1357  BP: 113/77  Pulse: (!) 111  Weight: 202 lb 3.2 oz (91.7 kg)  Body mass index is 34.71 kg/m.        Physical Examination:   General appearance: Well appearing, and in no distress  Mental status: Alert, oriented to person, place, and time  Skin: Warm & dry  Cardiovascular: Normal heart rate noted  Respiratory: Normal respiratory effort, no distress  Abdomen: Soft, gravid, nontender  Pelvic: Cervical exam deferred         Extremities: Edema: None  Fetal Status: Fetal Heart Rate (bpm): 150 Fundal Height: 32 cm Movement: Present    No results found for this or any previous visit (from the past 24 hour(s)).  Assessment & Plan:  1) Low-risk pregnancy G5P4004 at [redacted]w[redacted]d with an Estimated Date of Delivery: 07/14/19   2) Supervision of other normal pregnancy, antepartum  - Plan: Prenat-FeAsp-Meth-FA-DHA w/o A (PRENATE PIXIE) 10-0.6-0.4-200 MG CAPS  3) Group B streptococcal bacteriuria - Will need antibiotics in labor  4) Vaginal discharge during pregnancy in  third trimester  - Cervicovaginal ancillary only( [place]) - Rx for  clotrimazole (GYNE-LOTRIMIN) 1 % vaginal cream,   5) Vaginal itching  - Cervicovaginal ancillary only( [place]),  - Rx for clotrimazole (GYNE-LOTRIMIN) 1 % vaginal cream,    Meds:  Meds ordered this encounter  Medications  . clotrimazole (GYNE-LOTRIMIN) 1 % vaginal cream    Sig: Place 1 Applicatorful vaginally at bedtime for 7 days.    Dispense:  45 g    Refill:  0    Order Specific Question:   Supervising Provider    Answer:   Donnamae Jude [[id number]]  . Prenat-FeAsp-Meth-FA-DHA w/o A (PRENATE PIXIE) 10-0.6-0.4-200 MG CAPS    Sig: Take 1 capsule by mouth daily.    Dispense:  30 capsule    Refill:  11    Order Specific Question:   Supervising Provider    Answer:   Donnamae Jude [[id number]]   Labs/procedures today: wet prep  Plan:  Continue routine obstetrical care   Reviewed: Preterm labor symptoms and general obstetric precautions including but not limited to vaginal bleeding, contractions, leaking of fluid and fetal movement were reviewed in detail with the patient.  All questions were answered. Has home bp cuff. Check bp weekly, let us know if >140/90.   Follow-up: Return in about 4 weeks (around 06/21/2019) for Return OB - My Chart video - already had vaginal swabs done.  No orders of the defined types were placed in this encounter.  Laury Deep MSN, CNM 05/25/2019 8:56 PM

## 2019-05-26 LAB — CERVICOVAGINAL ANCILLARY ONLY
Bacterial Vaginitis (gardnerella): POSITIVE — AB
Candida Glabrata: NEGATIVE
Candida Vaginitis: POSITIVE — AB
Chlamydia: NEGATIVE
Comment: NEGATIVE
Comment: NEGATIVE
Comment: NEGATIVE
Comment: NEGATIVE
Comment: NEGATIVE
Comment: NORMAL
Neisseria Gonorrhea: NEGATIVE
Trichomonas: NEGATIVE

## 2019-05-27 ENCOUNTER — Other Ambulatory Visit: Payer: Self-pay | Admitting: Obstetrics and Gynecology

## 2019-05-27 DIAGNOSIS — N760 Acute vaginitis: Secondary | ICD-10-CM

## 2019-05-27 MED ORDER — METRONIDAZOLE 500 MG PO TABS: 500.0000 mg | ORAL_TABLET | Freq: Two times a day (BID) | ORAL | 0 refills | Status: DC

## 2019-05-27 NOTE — Progress Notes (Signed)
Rx sent for BV. Msg sent to have RN contact patient about dx and Rx.  Laury Deep, CNM
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
## 2019-06-16 NOTE — L&D Delivery Note (Addendum)
OB/GYN Faculty Practice Delivery Note  Ariana [name] is a 34 y.o. U2G2542 at [redacted]w[redacted]d. She was admitted for .   ROM: 0h 69m with light meconium fluid GBS Status: Positive, 3 hours of Ampicillin ppx Maximum Maternal Temperature: 98.3*F  Labor Progress: Patient presented in SOL CVE 5/80/-1 and progressed to an anterior cervical lip with AROM with light meconium fluid immediately prior to delivery.  Delivery Date/Time: 07/21/2019 at 0019 Delivery: Called to room and patient was complete and pushing. Head delivered left occiput anterior. No nuchal cord present. Shoulder and body delivered in usual fashion. Infant with spontaneous cry, placed on mother's abdomen, dried and stimulated. Cord clamped x 2 after 1-minute delay, and cut by this provider. Cord blood drawn. Placenta delivered spontaneously with gentle cord traction. Fundus firm with massage and Pitocin. Labia, perineum, vagina, and cervix inspected only a hemostatic left periurethral abrasion.  Placenta: 3VC, intact, to L&D Complications: none Lacerations: noen EBL: 400 Analgesia: none  Postpartum Planning [ x] message to sent to schedule follow-up  [x]  vaccines UTD  Infant: female  APGARs 9,9  weight pending per medical chart  , MD [facility], PGY-1 07/21/2019, 12:37 AM  The above was performed under my direct supervision and guidance.

## 2019-06-21 ENCOUNTER — Other Ambulatory Visit: Payer: Self-pay

## 2019-06-21 ENCOUNTER — Telehealth (INDEPENDENT_AMBULATORY_CARE_PROVIDER_SITE_OTHER): Payer: Medicaid Other | Admitting: Certified Nurse Midwife

## 2019-06-21 VITALS — BP 138/79

## 2019-06-21 DIAGNOSIS — Z3A36 36 weeks gestation of pregnancy: Secondary | ICD-10-CM | POA: Diagnosis not present

## 2019-06-21 DIAGNOSIS — Z3480 Encounter for supervision of other normal pregnancy, unspecified trimester: Secondary | ICD-10-CM

## 2019-06-21 DIAGNOSIS — Z3483 Encounter for supervision of other normal pregnancy, third trimester: Secondary | ICD-10-CM | POA: Diagnosis not present

## 2019-06-21 DIAGNOSIS — R8271 Bacteriuria: Secondary | ICD-10-CM

## 2019-06-21 NOTE — Progress Notes (Signed)
   TELEHEALTH VIRTUAL OBSTETRICS VISIT ENCOUNTER NOTE  I connected with Ariana [name] on 06/21/19 at  3:26 PM EST by telephone at home and verified that I am speaking with the correct person using two identifiers.   I discussed the limitations, risks, security and privacy concerns of performing an evaluation and management service by telephone and the availability of in person appointments. I also discussed with the patient that there may be a patient responsible charge related to this service. The patient expressed understanding and agreed to proceed.  Subjective:  Ariana [name] is a 34 y.o. [id number] at [redacted]w[redacted]d being followed for ongoing prenatal care.  She is currently monitored for the following issues for this low-risk pregnancy and has Supervision of other normal pregnancy, antepartum; Nausea and vomiting during pregnancy; History of gestational diabetes in prior pregnancy, currently pregnant; Alpha thalassemia silent carrier; and Group B streptococcal bacteriuria on their problem list.  Patient reports no complaints. Reports fetal movement. Denies any contractions, bleeding or leaking of fluid.   The following portions of the patient's history were reviewed and updated as appropriate: allergies, current medications, past family history, past medical history, past social history, past surgical history and problem list.   Objective:   General:  Alert, oriented and cooperative.   Mental Status: Normal mood and affect perceived. Normal judgment and thought content.  Rest of physical exam deferred due to type of encounter  Assessment and Plan:  Pregnancy: G5P4004 at [redacted]w[redacted]d 1. Supervision of other normal pregnancy, antepartum - Patient doing well, no complaints  - Reports being unable to due mychart or webex visit d/t phone reasons and needs telephone appointment  - Patient was recently treated for BV - reports vaginal discharge and irritation has resolved  - Denies vaginal bleeding or urinary  symptoms  - Reports occasional braxton hicks contractions that are not painful  - Routine prenatal care - Anticipatory guidance on upcoming appointments with in person visit in 2 weeks   2. Group B streptococcal bacteriuria - Treat in labor    Preterm labor symptoms and general obstetric precautions including but not limited to vaginal bleeding, contractions, leaking of fluid and fetal movement were reviewed in detail with the patient.  I discussed the assessment and treatment plan with the patient. The patient was provided an opportunity to ask questions and all were answered. The patient agreed with the plan and demonstrated an understanding of the instructions. The patient was advised to call back or seek an in-person office evaluation/go to [facility] at [facility] for any urgent or concerning symptoms. Please refer to After Visit Summary for other counseling recommendations.   I provided 8 minutes of non-face-to-face time during this encounter.  Return in about 16 days (around 07/07/2019) for ROB- in person 39 weeks .   Sharyon Cable, CNM [facility] [employer], [facility]

## 2019-07-07 ENCOUNTER — Other Ambulatory Visit: Payer: Self-pay

## 2019-07-07 ENCOUNTER — Ambulatory Visit (INDEPENDENT_AMBULATORY_CARE_PROVIDER_SITE_OTHER): Payer: Medicaid Other | Admitting: Obstetrics and Gynecology

## 2019-07-07 DIAGNOSIS — Z3480 Encounter for supervision of other normal pregnancy, unspecified trimester: Secondary | ICD-10-CM

## 2019-07-07 DIAGNOSIS — O219 Vomiting of pregnancy, unspecified: Secondary | ICD-10-CM

## 2019-07-07 DIAGNOSIS — Z3A39 39 weeks gestation of pregnancy: Secondary | ICD-10-CM

## 2019-07-07 MED ORDER — PRENATE PIXIE 10-0.6-0.4-200 MG PO CAPS: 1.0000 | ORAL_CAPSULE | Freq: Every day | ORAL | 3 refills | Status: DC

## 2019-07-07 MED ORDER — ONDANSETRON 4 MG PO TBDP: ORAL_TABLET | ORAL | 3 refills | Status: DC

## 2019-07-07 NOTE — Progress Notes (Signed)
Pt presents for ROB. 

## 2019-07-07 NOTE — Progress Notes (Signed)
   PRENATAL VISIT NOTE  Subjective:  Ariana [name] is a 34 y.o. D3U2025 at [redacted]w[redacted]d being seen today for ongoing prenatal care.  She is currently monitored for the following issues for this low-risk pregnancy and has Supervision of other normal pregnancy, antepartum; Nausea and vomiting during pregnancy; History of gestational diabetes in prior pregnancy, currently pregnant; Alpha thalassemia silent carrier; and Group B streptococcal bacteriuria on their problem list.  Patient reports no complaints.  Contractions: Not present. Vag. Bleeding: None.  Movement: Present. Denies leaking of fluid.   The following portions of the patient's history were reviewed and updated as appropriate: allergies, current medications, past family history, past medical history, past social history, past surgical history and problem list.   Objective:   Vitals:   07/07/19 0944  BP: 125/84  Pulse: (!) 104  Weight: 206 lb 1.6 oz (93.5 kg)    Fetal Status: Fetal Heart Rate (bpm): 150 Fundal Height: 38 cm Movement: Present     General:  Alert, oriented and cooperative. Patient is in no acute distress.  Skin: Skin is warm and dry. No rash noted.   Cardiovascular: Normal heart rate noted  Respiratory: Normal respiratory effort, no problems with respiration noted  Abdomen: Soft, gravid, appropriate for gestational age.  Pain/Pressure: Present     Pelvic: Cervical exam performed        Extremities: Normal range of motion.  Edema: None  Mental Status: Normal mood and affect. Normal behavior. Normal judgment and thought content.   Assessment and Plan:  Pregnancy: G5P4004 at [redacted]w[redacted]d 1. Nausea and vomiting during pregnancy Patient requested refills on Zofran. - ondansetron (ZOFRAN-ODT) 4 MG disintegrating tablet; DISSOLVE 1 TABLET(4 MG) ON THE TONGUE EVERY 6 HOURS AS NEEDED FOR NAUSEA  Dispense: 20 tablet; Refill: 3  2. Supervision of other normal pregnancy, antepartum Patient feels well. Reports perineal pressure.  Stripped membranes today. Follow-up in 1 week if labor does not initiative before then. Counseled on [employer] and nipple stimulation to induce contractions. Patient requests Nexplanon for post-partum BC. - Prenat-FeAsp-Meth-FA-DHA w/o A (PRENATE PIXIE) 10-0.6-0.4-200 MG CAPS; Take 1 capsule by mouth daily.  Dispense: 90 capsule; Refill: 3  Patient not interested in scheduling induction of labor at this time. Ok to wait another week to discuss.    Term labor symptoms and general obstetric precautions including but not limited to vaginal bleeding, contractions, leaking of fluid and fetal movement were reviewed in detail with the patient. Please refer to After Visit Summary for other counseling recommendations.   Return in about 1 week (around 07/14/2019) for For in person visit .  Future Appointments  Date Time Provider Department Center  07/14/2019 11:15 AM Marny Lowenstein, PA-C [facility] None    Venia Carbon, NP

## 2019-07-14 ENCOUNTER — Ambulatory Visit (INDEPENDENT_AMBULATORY_CARE_PROVIDER_SITE_OTHER): Payer: Medicaid Other | Admitting: Medical

## 2019-07-14 ENCOUNTER — Other Ambulatory Visit: Payer: Self-pay

## 2019-07-14 VITALS — BP 122/83 | HR 112 | Wt 206.0 lb

## 2019-07-14 DIAGNOSIS — O09293 Supervision of pregnancy with other poor reproductive or obstetric history, third trimester: Secondary | ICD-10-CM

## 2019-07-14 DIAGNOSIS — D563 Thalassemia minor: Secondary | ICD-10-CM

## 2019-07-14 DIAGNOSIS — R8271 Bacteriuria: Secondary | ICD-10-CM

## 2019-07-14 DIAGNOSIS — Z3480 Encounter for supervision of other normal pregnancy, unspecified trimester: Secondary | ICD-10-CM

## 2019-07-14 DIAGNOSIS — O09299 Supervision of pregnancy with other poor reproductive or obstetric history, unspecified trimester: Secondary | ICD-10-CM

## 2019-07-14 DIAGNOSIS — Z8632 Personal history of gestational diabetes: Secondary | ICD-10-CM

## 2019-07-14 DIAGNOSIS — Z3A40 40 weeks gestation of pregnancy: Secondary | ICD-10-CM

## 2019-07-14 NOTE — Patient Instructions (Signed)
Fetal Movement Counts Patient Name: ________________________________________________ Patient Due Date: ____________________ What is a fetal movement count?  A fetal movement count is the number of times that you feel your baby move during a certain amount of time. This may also be called a fetal kick count. A fetal movement count is recommended for every pregnant woman. You may be asked to start counting fetal movements as early as week 28 of your pregnancy. Pay attention to when your baby is most active. You may notice your baby's sleep and wake cycles. You may also notice things that make your baby move more. You should do a fetal movement count:  When your baby is normally most active.  At the same time each day. A good time to count movements is while you are resting, after having something to eat and drink. How do I count fetal movements? 1. Find a quiet, comfortable area. Sit, or lie down on your side. 2. Write down the date, the start time and stop time, and the number of movements that you felt between those two times. Take this information with you to your health care visits. 3. Write down your start time when you feel the first movement. 4. Count kicks, flutters, swishes, rolls, and jabs. You should feel at least 10 movements. 5. You may stop counting after you have felt 10 movements, or if you have been counting for 2 hours. Write down the stop time. 6. If you do not feel 10 movements in 2 hours, contact your health care provider for further instructions. Your health care provider may want to do additional tests to assess your baby's well-being. Contact a health care provider if:  You feel fewer than 10 movements in 2 hours.  Your baby is not moving like he or she usually does. Date: ____________ Start time: ____________ Stop time: ____________ Movements: ____________ Date: ____________ Start time: ____________ Stop time: ____________ Movements: ____________ Date: ____________  Start time: ____________ Stop time: ____________ Movements: ____________ Date: ____________ Start time: ____________ Stop time: ____________ Movements: ____________ Date: ____________ Start time: ____________ Stop time: ____________ Movements: ____________ Date: ____________ Start time: ____________ Stop time: ____________ Movements: ____________ Date: ____________ Start time: ____________ Stop time: ____________ Movements: ____________ Date: ____________ Start time: ____________ Stop time: ____________ Movements: ____________ Date: ____________ Start time: ____________ Stop time: ____________ Movements: ____________ This information is not intended to replace advice given to you by your health care provider. Make sure you discuss any questions you have with your health care provider. Document Revised: 01/19/2019 Document Reviewed: 01/19/2019 Elsevier Patient Education  2020 Elsevier Inc. Braxton Hicks Contractions Contractions of the uterus can occur throughout pregnancy, but they are not always a sign that you are in labor. You may have practice contractions called Braxton Hicks contractions. These false labor contractions are sometimes confused with true labor. What are Braxton Hicks contractions? Braxton Hicks contractions are tightening movements that occur in the muscles of the uterus before labor. Unlike true labor contractions, these contractions do not result in opening (dilation) and thinning of the cervix. Toward the end of pregnancy (32-34 weeks), Braxton Hicks contractions can happen more often and may become stronger. These contractions are sometimes difficult to tell apart from true labor because they can be very uncomfortable. You should not feel embarrassed if you go to the hospital with false labor. Sometimes, the only way to tell if you are in true labor is for your health care provider to look for changes in the cervix. The health care provider   will do a physical exam and may  monitor your contractions. If you are not in true labor, the exam should show that your cervix is not dilating and your water has not broken. If there are no other health problems associated with your pregnancy, it is completely safe for you to be sent home with false labor. You may continue to have Braxton Hicks contractions until you go into true labor. How to tell the difference between true labor and false labor True labor  Contractions last 30-70 seconds.  Contractions become very regular.  Discomfort is usually felt in the top of the uterus, and it spreads to the lower abdomen and low back.  Contractions do not go away with walking.  Contractions usually become more intense and increase in frequency.  The cervix dilates and gets thinner. False labor  Contractions are usually shorter and not as strong as true labor contractions.  Contractions are usually irregular.  Contractions are often felt in the front of the lower abdomen and in the groin.  Contractions may go away when you walk around or change positions while lying down.  Contractions get weaker and are shorter-lasting as time goes on.  The cervix usually does not dilate or become thin. Follow these instructions at home:   Take over-the-counter and prescription medicines only as told by your health care provider.  Keep up with your usual exercises and follow other instructions from your health care provider.  Eat and drink lightly if you think you are going into labor.  If Braxton Hicks contractions are making you uncomfortable: ? Change your position from lying down or resting to walking, or change from walking to resting. ? Sit and rest in a tub of warm water. ? Drink enough fluid to keep your urine pale yellow. Dehydration may cause these contractions. ? Do slow and deep breathing several times an hour.  Keep all follow-up prenatal visits as told by your health care provider. This is important. Contact a  health care provider if:  You have a fever.  You have continuous pain in your abdomen. Get help right away if:  Your contractions become stronger, more regular, and closer together.  You have fluid leaking or gushing from your vagina.  You pass blood-tinged mucus (bloody show).  You have bleeding from your vagina.  You have low back pain that you never had before.  You feel your baby's head pushing down and causing pelvic pressure.  Your baby is not moving inside you as much as it used to. Summary  Contractions that occur before labor are called Braxton Hicks contractions, false labor, or practice contractions.  Braxton Hicks contractions are usually shorter, weaker, farther apart, and less regular than true labor contractions. True labor contractions usually become progressively stronger and regular, and they become more frequent.  Manage discomfort from Braxton Hicks contractions by changing position, resting in a warm bath, drinking plenty of water, or practicing deep breathing. This information is not intended to replace advice given to you by your health care provider. Make sure you discuss any questions you have with your health care provider. Document Revised: 05/14/2017 Document Reviewed: 10/15/2016 Elsevier Patient Education  2020 Elsevier Inc.  

## 2019-07-14 NOTE — Progress Notes (Signed)
   PRENATAL VISIT NOTE  Subjective:  Ariana [name] is a 34 y.o. [id number] at [redacted]w[redacted]d being seen today for ongoing prenatal care.  She is currently monitored for the following issues for this low-risk pregnancy and has Supervision of other normal pregnancy, antepartum; Nausea and vomiting during pregnancy; History of gestational diabetes in prior pregnancy, currently pregnant; Alpha thalassemia silent carrier; and Group B streptococcal bacteriuria on their problem list.  Patient reports no complaints.  Contractions: Irregular. Vag. Bleeding: None.  Movement: Present. Denies leaking of fluid.   The following portions of the patient's history were reviewed and updated as appropriate: allergies, current medications, past family history, past medical history, past social history, past surgical history and problem list.   Objective:   Vitals:   07/14/19 1126  BP: 122/83  Pulse: (!) 112  Weight: 206 lb (93.4 kg)    Fetal Status: Fetal Heart Rate (bpm): 154 Fundal Height: 39 cm Movement: Present     General:  Alert, oriented and cooperative. Patient is in no acute distress.  Skin: Skin is warm and dry. No rash noted.   Cardiovascular: Normal heart rate noted  Respiratory: Normal respiratory effort, no problems with respiration noted  Abdomen: Soft, gravid, appropriate for gestational age.  Pain/Pressure: Present     Pelvic: Cervical exam deferred        Extremities: Normal range of motion.  Edema: None  Mental Status: Normal mood and affect. Normal behavior. Normal judgment and thought content.   Assessment and Plan:  Pregnancy: G5P4004 at [redacted]w[redacted]d 1. Supervision of other normal pregnancy, antepartum  Doing well, few contractions, declines cervix check today   Discussed plan to schedule IOL for next week if not delivered. Ordered entered.   2. History of gestational diabetes in prior pregnancy, currently pregnant - Normal 2 hour with this pregnancy   3. Group B streptococcal bacteriuria -  Treat in labor   4. Alpha thalassemia silent carrier  Term labor symptoms and general obstetric precautions including but not limited to vaginal bleeding, contractions, leaking of fluid and fetal movement were reviewed in detail with the patient. Please refer to After Visit Summary for other counseling recommendations.   Return in about 5 weeks (around 08/18/2019) for PP visit.  No future appointments.  Vonzella Nipple, PA-C

## 2019-07-16 ENCOUNTER — Other Ambulatory Visit: Payer: Self-pay | Admitting: Family Medicine

## 2019-07-17 ENCOUNTER — Telehealth (HOSPITAL_COMMUNITY): Payer: Self-pay | Admitting: *Deleted

## 2019-07-17 NOTE — Telephone Encounter (Signed)
Preadmission screen  

## 2019-07-19 ENCOUNTER — Other Ambulatory Visit (HOSPITAL_COMMUNITY)
Admission: RE | Admit: 2019-07-19 | Discharge: 2019-07-19 | Disposition: A | Discharge status: Home / Self Care | Payer: Medicaid Other | Source: Ambulatory Visit | Attending: Obstetrics and Gynecology | Admitting: Obstetrics and Gynecology

## 2019-07-19 ENCOUNTER — Other Ambulatory Visit (HOSPITAL_COMMUNITY): Admission: RE | Admit: 2019-07-19 | Payer: Medicaid Other | Source: Ambulatory Visit

## 2019-07-19 LAB — SARS CORONAVIRUS 2 (TAT 6-24 HRS): SARS Coronavirus 2: NEGATIVE

## 2019-07-20 ENCOUNTER — Other Ambulatory Visit: Payer: Self-pay

## 2019-07-20 ENCOUNTER — Other Ambulatory Visit (HOSPITAL_COMMUNITY): Payer: Medicaid Other

## 2019-07-20 ENCOUNTER — Other Ambulatory Visit (HOSPITAL_COMMUNITY): Payer: Self-pay | Admitting: Advanced Practice Midwife

## 2019-07-20 ENCOUNTER — Encounter (HOSPITAL_COMMUNITY): Payer: Self-pay | Admitting: Family Medicine

## 2019-07-20 ENCOUNTER — Inpatient Hospital Stay (HOSPITAL_COMMUNITY)
Admission: AD | Admit: 2019-07-20 | Discharge: 2019-07-22 | DRG: 807 | Disposition: A | Discharge status: Home / Self Care | Payer: Medicaid Other | Attending: Family Medicine | Admitting: Family Medicine

## 2019-07-20 DIAGNOSIS — O99824 Streptococcus B carrier state complicating childbirth: Secondary | ICD-10-CM | POA: Diagnosis present

## 2019-07-20 DIAGNOSIS — Z3480 Encounter for supervision of other normal pregnancy, unspecified trimester: Secondary | ICD-10-CM

## 2019-07-20 DIAGNOSIS — Z30017 Encounter for initial prescription of implantable subdermal contraceptive: Secondary | ICD-10-CM | POA: Diagnosis not present

## 2019-07-20 DIAGNOSIS — Z3A40 40 weeks gestation of pregnancy: Secondary | ICD-10-CM

## 2019-07-20 DIAGNOSIS — O26893 Other specified pregnancy related conditions, third trimester: Secondary | ICD-10-CM | POA: Diagnosis present

## 2019-07-20 DIAGNOSIS — D563 Thalassemia minor: Secondary | ICD-10-CM | POA: Diagnosis present

## 2019-07-20 DIAGNOSIS — Z20822 Contact with and (suspected) exposure to covid-19: Secondary | ICD-10-CM | POA: Diagnosis present

## 2019-07-20 LAB — CBC
HCT: 36.3 % (ref 36.0–46.0)
Hemoglobin: 11.2 g/dL — ABNORMAL LOW (ref 12.0–15.0)
MCH: 28.2 pg (ref 26.0–34.0)
MCHC: 30.9 g/dL (ref 30.0–36.0)
MCV: 91.4 fL (ref 80.0–100.0)
Platelets: 287 10*3/uL (ref 150–400)
RBC: 3.97 MIL/uL (ref 3.87–5.11)
RDW: 13.3 % (ref 11.5–15.5)
WBC: 8.5 10*3/uL (ref 4.0–10.5)
nRBC: 0 % (ref 0.0–0.2)

## 2019-07-20 LAB — TYPE AND SCREEN
ABO/RH(D): O POS
Antibody Screen: NEGATIVE

## 2019-07-20 LAB — ABO/RH: ABO/RH(D): O POS

## 2019-07-20 MED ORDER — LIDOCAINE HCL (PF) 1 % IJ SOLN: 30.0000 mL | INTRAMUSCULAR | Status: DC | PRN

## 2019-07-20 MED ORDER — ACETAMINOPHEN 325 MG PO TABS: 650.0000 mg | ORAL_TABLET | ORAL | Status: DC | PRN

## 2019-07-20 MED ORDER — SOD CITRATE-CITRIC ACID 500-334 MG/5ML PO SOLN: 30.0000 mL | ORAL | Status: DC | PRN

## 2019-07-20 MED ORDER — OXYTOCIN BOLUS FROM INFUSION
500.0000 mL | Freq: Once | INTRAVENOUS | Status: AC
  Administered 2019-07-21: 500 mL via INTRAVENOUS

## 2019-07-20 MED ORDER — LACTATED RINGERS IV SOLN: INTRAVENOUS | Status: DC

## 2019-07-20 MED ORDER — OXYTOCIN 40 UNITS IN NORMAL SALINE INFUSION - SIMPLE MED
2.5000 [IU]/h | INTRAVENOUS | Status: DC
  Administered 2019-07-21: 01:00:00 2.5 [IU]/h via INTRAVENOUS

## 2019-07-20 MED ORDER — SODIUM CHLORIDE 0.9 % IV SOLN
2.0000 g | Freq: Once | INTRAVENOUS | Status: AC
  Administered 2019-07-20: 2 g via INTRAVENOUS
  Filled 2019-07-20: qty 2000

## 2019-07-20 MED ORDER — LACTATED RINGERS IV SOLN: 500.0000 mL | INTRAVENOUS | Status: DC | PRN

## 2019-07-20 MED ORDER — FENTANYL CITRATE (PF) 100 MCG/2ML IJ SOLN: 50.0000 ug | INTRAMUSCULAR | Status: DC | PRN

## 2019-07-20 MED ORDER — ONDANSETRON HCL 4 MG/2ML IJ SOLN: 4.0000 mg | Freq: Four times a day (QID) | INTRAMUSCULAR | Status: DC | PRN

## 2019-07-20 NOTE — H&P (Addendum)
OBSTETRIC ADMISSION HISTORY AND PHYSICAL  Ariana [name] is a 34 y.o. female [phone] with IUP at 54w6dby 6wk [place] for SOL. She reports +FMs, No LOF, no VB, no blurry vision, headaches or peripheral edema, and RUQ pain.  She plans on breast and bottle feeding. She requests nexplanon for birth control. [place]: By 6wk [place]--->  Estimated Date of Delivery: 07/14/19  Sono:    _0 , CWD, normal anatomy, cephalic presentation, posterior placental lie, 188g, 34% EFW _1 , CWD, normal anatomy, breech presentation, posterior placental lie, 799g, 25% EFW  Prenatal History/Complications: H/O gDM in prior pregnancies Alpha thalassemia silent carrier, (aa/a-) GBS positive  Past Medical History: Past Medical History:  Diagnosis Date   Alpha thalassemia silent carrier    Gestational diabetes     Past Surgical History: History reviewed. No pertinent surgical history.  Obstetrical History: OB History     Gravida  5   Para  4   Term  4   Preterm      AB      Living  4      SAB      TAB      Ectopic      Multiple  0   Live Births  4           Social History: Social History   Socioeconomic History   Marital status: Married    Spouse name: Not on file   Number of children: Not on file   Years of education: Not on file   Highest education level: Not on file  Occupational History   Not on file  Tobacco Use   Smoking status: Never Smoker   Smokeless tobacco: Never Used  Substance and Sexual Activity   Alcohol use: No   Drug use: No   Sexual activity: Yes    Birth control/protection: None  Other Topics Concern   Not on file  Social History Narrative   Not on file   Social Determinants of Health   Financial Resource Strain:    Difficulty of Paying Living Expenses: Not on file  Food Insecurity:    Worried About RCharity fundraiserin the Last Year: Not on file   RYRC Worldwideof Food in the Last Year: Not  on file  Transportation Needs:    Lack of Transportation (Medical): Not on file   Lack of Transportation (Non-Medical): Not on file  Physical Activity:    Days of Exercise per Week: Not on file   Minutes of Exercise per Session: Not on file  Stress:    Feeling of Stress : Not on file  Social Connections:    Frequency of Communication with Friends and Family: Not on file   Frequency of Social Gatherings with Friends and Family: Not on file   Attends Religious Services: Not on file   Active Member of Clubs or Organizations: Not on file   Attends CArchivistMeetings: Not on file   Marital Status: Not on file    Family History: Family History  Problem Relation Age of Onset   Healthy Mother     Allergies: Allergies  Allergen Reactions   5-Alpha Reductase Inhibitors     Medications Prior to Admission  Medication Sig Dispense Refill Last Dose   glycopyrrolate (ROBINUL) 2 MG tablet Take 1 tablet (2 mg total) by mouth 3 (three) times daily as needed. 90 tablet 3 Past Week at Unknown time  ondansetron (ZOFRAN-ODT) 4 MG disintegrating tablet DISSOLVE 1 TABLET(4 MG) ON THE TONGUE EVERY 6 HOURS AS NEEDED FOR NAUSEA 20 tablet 3 Past Week at Unknown time   Prenat-FeAsp-Meth-FA-DHA w/o A (PRENATE PIXIE) 10-0.6-0.4-200 MG CAPS Take 1 capsule by mouth daily. 90 capsule 3 07/20/2019 at Unknown time   acetaminophen (TYLENOL) 500 MG tablet Take 1 tablet (500 mg total) by mouth every 6 (six) hours as needed. 30 tablet 0    Elastic Bandages & Supports (COMFORT FIT MATERNITY SUPP MED) MISC 1 Device by Does not apply route daily. 1 each 0    Misc. Devices MISC Dispense one maternity belt for patient 1 each 0      Review of Systems   All systems reviewed and negative except as stated in HPI  Blood pressure (!) 149/86, pulse (!) 105, temperature 98.3 F (36.8 C), temperature source Oral, resp. rate 18, height _0  (1.626 m), weight 93.5 kg, last menstrual period 09/29/2018, SpO2 100 %,  currently breastfeeding. General appearance: alert, cooperative, appears stated age and no distress, uncomfortable with ctx Lungs: normal effort Heart: regular rate  Abdomen: soft, non-tender; bowel sounds normal Pelvic: gravid uterus GU: No vaginal lesions  Extremities: Homans sign is negative, no sign of DVT DTR's intact Presentation: cephalic by CVE Fetal monitoringBaseline: 155 bpm, Variability: Good {> 6 bpm), Accelerations: Reactive and Decelerations: Absent Uterine activity: Frequency: Every 2-3 minutes Dilation: 5 Effacement (%): 80 Station: -1 Exam by:: Delena Serve, RN   Prenatal labs: ABO, Rh: O/Positive/-- (07/24 0932) Antibody: Negative (07/24 0932) Rubella: 25.30 (07/24 0932) RPR: Non Reactive (11/10 0851)  HBsAg: Negative (07/24 0932)  HIV: Non Reactive (11/10 0851)  GBS:   Positive 2 hr Glucola WNL Genetic screening  Low risk NIPS, silent carrier alpha thal Anatomy US WNL  Prenatal Transfer Tool  Maternal Diabetes: No Genetic Screening: Abnormal:  Results: Other: Low risk NIPS, silent carrier alpha thalassemia Maternal Ultrasounds/Referrals: Normal Fetal Ultrasounds or other Referrals:  None Maternal Substance Abuse:  No Significant Maternal Medications:  None Significant Maternal Lab Results: Group B Strep positive  No results found for this or any previous visit (from the past 24 hour(s)).  Patient Active Problem List   Diagnosis Date Noted   Group B streptococcal bacteriuria 02/01/2019   Alpha thalassemia silent carrier 01/22/2019   Supervision of other normal pregnancy, antepartum 01/06/2019   Nausea and vomiting during pregnancy 01/06/2019   History of gestational diabetes in prior pregnancy, currently pregnant 01/06/2019    Assessment/Plan:  Ariana [name] is a 34 y.o. [id number] at 26w6dhere for SOL  #Labor: SOL, fifth baby, all vaginal births previously without complication. Plan for now to do expectant management with anticipated  SVD. #Pain: Per patient request #FWB: Cat I; EFW: 7# #ID:  GBS positive, Amp ordered #MOF: both #MOC: Nexplanon #Circ:  n/a  CGladys Damme MD CMalottResidency, PGY-1 07/20/2019, 8:33 PM   I personally saw and evaluated the patient, performing the key elements of the service. I developed and verified the management plan that is described in the resident's/student's note, and I agree with the content with my edits above. VSS, HRR&R, Resp unlabored, Legs neg.  FNigel Berthold CNM 07/21/2019 12:27 AM

## 2019-07-20 NOTE — MAU Note (Signed)
PT SAYS UN STRONG SINCE 4 .  PNC  WITH  FEMINA - VE  1 WEEK AGO  VE 2 CM. + FETAL MOVEMENT

## 2019-07-21 ENCOUNTER — Inpatient Hospital Stay (HOSPITAL_COMMUNITY)
Admission: AD | Admit: 2019-07-21 | Payer: Medicaid Other | Source: Home / Self Care | Admitting: Obstetrics and Gynecology

## 2019-07-21 ENCOUNTER — Encounter (HOSPITAL_COMMUNITY): Payer: Self-pay | Admitting: Family Medicine

## 2019-07-21 ENCOUNTER — Inpatient Hospital Stay (HOSPITAL_COMMUNITY): Payer: Medicaid Other

## 2019-07-21 LAB — RPR: RPR Ser Ql: NONREACTIVE

## 2019-07-21 MED ORDER — PRENATAL MULTIVITAMIN CH
1.0000 | ORAL_TABLET | Freq: Every day | ORAL | Status: DC
  Administered 2019-07-21 – 2019-07-22 (×2): 1 via ORAL
  Filled 2019-07-21 (×2): qty 1

## 2019-07-21 MED ORDER — OXYCODONE HCL 5 MG PO TABS: 5.0000 mg | ORAL_TABLET | ORAL | Status: DC | PRN

## 2019-07-21 MED ORDER — WITCH HAZEL-GLYCERIN EX PADS: 1.0000 "application " | MEDICATED_PAD | CUTANEOUS | Status: DC | PRN

## 2019-07-21 MED ORDER — SENNOSIDES-DOCUSATE SODIUM 8.6-50 MG PO TABS
2.0000 | ORAL_TABLET | ORAL | Status: DC
  Administered 2019-07-22: 01:00:00 2 via ORAL
  Filled 2019-07-21: qty 2

## 2019-07-21 MED ORDER — ACETAMINOPHEN 325 MG PO TABS
650.0000 mg | ORAL_TABLET | ORAL | Status: DC | PRN
  Administered 2019-07-22: 15:00:00 650 mg via ORAL
  Filled 2019-07-21: qty 2

## 2019-07-21 MED ORDER — DIPHENHYDRAMINE HCL 25 MG PO CAPS: 25.0000 mg | ORAL_CAPSULE | Freq: Four times a day (QID) | ORAL | Status: DC | PRN

## 2019-07-21 MED ORDER — IBUPROFEN 600 MG PO TABS
600.0000 mg | ORAL_TABLET | Freq: Four times a day (QID) | ORAL | Status: DC
  Administered 2019-07-21 – 2019-07-22 (×6): 600 mg via ORAL
  Filled 2019-07-21 (×6): qty 1

## 2019-07-21 MED ORDER — TETANUS-DIPHTH-ACELL PERTUSSIS 5-2.5-18.5 LF-MCG/0.5 IM SUSP: 0.5000 mL | Freq: Once | INTRAMUSCULAR | Status: DC

## 2019-07-21 MED ORDER — ONDANSETRON HCL 4 MG/2ML IJ SOLN: 4.0000 mg | INTRAMUSCULAR | Status: DC | PRN

## 2019-07-21 MED ORDER — DIBUCAINE (PERIANAL) 1 % EX OINT: 1.0000 "application " | TOPICAL_OINTMENT | CUTANEOUS | Status: DC | PRN

## 2019-07-21 MED ORDER — OXYCODONE HCL 5 MG PO TABS: 10.0000 mg | ORAL_TABLET | ORAL | Status: DC | PRN

## 2019-07-21 MED ORDER — ONDANSETRON HCL 4 MG PO TABS: 4.0000 mg | ORAL_TABLET | ORAL | Status: DC | PRN

## 2019-07-21 MED ORDER — SIMETHICONE 80 MG PO CHEW: 80.0000 mg | CHEWABLE_TABLET | ORAL | Status: DC | PRN

## 2019-07-21 MED ORDER — COCONUT OIL OIL: 1.0000 "application " | TOPICAL_OIL | Status: DC | PRN

## 2019-07-21 MED ORDER — ZOLPIDEM TARTRATE 5 MG PO TABS: 5.0000 mg | ORAL_TABLET | Freq: Every evening | ORAL | Status: DC | PRN

## 2019-07-21 MED ORDER — BENZOCAINE-MENTHOL 20-0.5 % EX AERO: 1.0000 "application " | INHALATION_SPRAY | CUTANEOUS | Status: DC | PRN

## 2019-07-21 NOTE — Lactation Note (Signed)
This note was copied from a baby's chart. Lactation Consultation Note  Patient Name: Ariana [name] HFWYO'V Date: 07/21/2019 Reason for consult: Follow-up assessment  P5 mother whose infant is now 52 hours old.  Mother breast fed all of her other children (lengths of time vary from 1 year-28 months).  Mother's feeding preference is breast/bottle.    Baby was asleep at mother's breast in the football position when I arrived.  Mother stated she has been sleepy and "will not wake up."  I asked permission to assist and mother accepted help.  Removed blanket from baby and checked diaper.  Showed mother how to rub and gently stimulate baby to awaken.  She began to arouse and opened her eyes.  Taught mother hand expression, however, she was unable to express drops at this time.  Attempted to latch baby again to the left breast in the football hold but she was not interested in opening her mouth. Placed her STS on mother's chest and she fell asleep.    Encouraged to continue observing for feeding cues  Reviewed cues with mother.  Asked her to continue practicing hand expression and to feed back any EBM she obtains to baby.  Demonstrated finger feeding.  Mother verbalized understanding.  Reminded mother to order lunch, but she stated that she could not "do it."  I handed her the menu and asked, "Can you read this?"  She stated, "No."  I read her menu and ordered her lunch.  Father had been assisting prior to this time but he was not present.  RN updated.  Mother will call for assistance as needed.  Mother does not have a DEBP.  She is a [facility] participant in [place] and plans to call them to obtain a DEBP.  Informed her that they are not open on the weekend and she may want to call today.       Maternal Data    Feeding    LATCH Score Latch: Too sleepy or reluctant, no latch achieved, no sucking elicited.                 Interventions    Lactation Tools Discussed/Used      Consult Status Consult Status: Follow-up Date: 07/22/19 Follow-up type: In-patient    Dora Sims 07/21/2019, 12:47 PM

## 2019-07-21 NOTE — Lactation Note (Signed)
This note was copied from a baby's chart. Lactation Consultation Note Baby 5 hrs old. Mom stated baby doesn't want to BF, she just tired. Baby swaddled in 2 blankets. Encouraged un-swaddling feed STS. Mom demonstrated hand expression w/colostrum noted. Mom's oldest of now 5 children is 35 yrs old and mom BF her 18 months, 2nd child 18 months, 3rd child 2 yrs and 4 months, and 4th child for 1 yr until she became expecting with this baby.  Mom denied any difficulties w/any of her children. Mom has compressible everted short shaft nipples. Discussed newborn feeding habits verses feeding toddlers, behavior, STS, I&O, breast massage, supply and demand. Mom's feeding choice is BF/formula feeding. Mom stated she usually does that until her milk comes because she doesn't have milk yet. Explained colostrum is a part of milk. Mom stated she didn't feel it is enough. LC discussed stomach size of newborns. Encouraged mom to BF before giving formula. Encouraged to call or assistance or questions. Lactation brochure given.  Patient Name: Ariana [name] NIDPO'E Date: 07/21/2019 Reason for consult: Initial assessment;Term   Maternal Data Has patient been taught Hand Expression?: Yes Does the patient have breastfeeding experience prior to this delivery?: Yes  Feeding    LATCH Score Latch: Too sleepy or reluctant, no latch achieved, no sucking elicited.  Audible Swallowing: None  Type of Nipple: Everted at rest and after stimulation  Comfort (Breast/Nipple): Soft / non-tender  Hold (Positioning): No assistance needed to correctly position infant at breast.  LATCH Score: 6  Interventions Interventions: Breast feeding basics reviewed;Hand express;Breast compression  Lactation Tools Discussed/Used WIC Program: Yes   Consult Status Consult Status: Follow-up Date: 07/22/19 Follow-up type: In-patient    Charyl Dancer 07/21/2019, 5:19 AM

## 2019-07-21 NOTE — Lactation Note (Signed)
This note was copied from a baby's chart. Lactation Consultation Note  Patient Name: Ariana [name] PVXYI'A Date: 07/21/2019 Reason for consult: Follow-up assessment   LC Follow Up With RN:  RN expressed concern that baby has not yet awakened for feedings.  She has worked with baby today.  I have also worked with her to awaken, however, she remains sleepy and not interested.  Mother has not been able to hand express colostrum at this time but has been encouraged to continue practicing this.  Since baby is a term infant and only 52 hours old I suggested that mother continue to practice what has been discussed with her today by RN and LC.  RN will inform night shift to be proactive in observing/assisting with latching and supplementation if needed after 24 hours of life.  Mother will call for assistance or questions as needed.    Consult Status Consult Status: Follow-up Date: 07/22/19 Follow-up type: In-patient    Antanasia Kaczynski R Devonn Giampietro 07/21/2019, 4:14 PM

## 2019-07-21 NOTE — Discharge Summary (Signed)
error 

## 2019-07-21 NOTE — Progress Notes (Signed)
This RN assisted pt to bathroom. As pt was going to sit on the toilet, this RN heard a "plop" sound in the toilet. A moderate amount of bleeding noted in pt's peri pad as well. Once pt done voiding; this RN removed clot from toilet. 2 large mango size clots noted. Pt asymptomatic and VS WNL. Fundus firm U/E.  This RN called to inform Dr. Leafy Kindle of happenings. Per MD he would put in labs and inform his attending. No new orders received.  Per Triton scale; estimated blood loss of 323.

## 2019-07-22 ENCOUNTER — Ambulatory Visit: Payer: Self-pay

## 2019-07-22 LAB — CBC
HCT: 27.4 % — ABNORMAL LOW (ref 36.0–46.0)
Hemoglobin: 8.6 g/dL — ABNORMAL LOW (ref 12.0–15.0)
MCH: 28.5 pg (ref 26.0–34.0)
MCHC: 31.4 g/dL (ref 30.0–36.0)
MCV: 90.7 fL (ref 80.0–100.0)
Platelets: 226 10*3/uL (ref 150–400)
RBC: 3.02 MIL/uL — ABNORMAL LOW (ref 3.87–5.11)
RDW: 13.4 % (ref 11.5–15.5)
WBC: 7 10*3/uL (ref 4.0–10.5)
nRBC: 0 % (ref 0.0–0.2)

## 2019-07-22 LAB — COMPREHENSIVE METABOLIC PANEL
ALT: 20 U/L (ref 0–44)
AST: 24 U/L (ref 15–41)
Albumin: 2.6 g/dL — ABNORMAL LOW (ref 3.5–5.0)
Alkaline Phosphatase: 96 U/L (ref 38–126)
Anion gap: 11 (ref 5–15)
BUN: 9 mg/dL (ref 6–20)
CO2: 23 mmol/L (ref 22–32)
Calcium: 8.7 mg/dL — ABNORMAL LOW (ref 8.9–10.3)
Chloride: 101 mmol/L (ref 98–111)
Creatinine, Ser: 0.77 mg/dL (ref 0.44–1.00)
GFR calc Af Amer: >60 mL/min (ref >60)
GFR calc non Af Amer: >60 mL/min (ref >60)
Glucose, Bld: 131 mg/dL — ABNORMAL HIGH (ref 70–99)
Potassium: 3.4 mmol/L — ABNORMAL LOW (ref 3.5–5.1)
Sodium: 135 mmol/L (ref 135–145)
Total Bilirubin: 1 mg/dL (ref 0.3–1.2)
Total Protein: 5.3 g/dL — ABNORMAL LOW (ref 6.5–8.1)

## 2019-07-22 MED ORDER — LIDOCAINE HCL 1 % IJ SOLN
0.0000 mL | Freq: Once | INTRAMUSCULAR | Status: AC | PRN
  Administered 2019-07-22: 20 mL via INTRADERMAL
  Filled 2019-07-22: qty 20

## 2019-07-22 MED ORDER — ACETAMINOPHEN 325 MG PO TABS: 650.0000 mg | ORAL_TABLET | ORAL | 1 refills | Status: DC | PRN

## 2019-07-22 MED ORDER — FERROUS SULFATE 325 (65 FE) MG PO TABS: 325.0000 mg | ORAL_TABLET | Freq: Every day | ORAL | Status: DC

## 2019-07-22 MED ORDER — NIFEDIPINE ER OSMOTIC RELEASE 30 MG PO TB24
30.0000 mg | ORAL_TABLET | Freq: Every day | ORAL | Status: DC
  Administered 2019-07-22: 30 mg via ORAL
  Filled 2019-07-22: qty 1

## 2019-07-22 MED ORDER — ETONOGESTREL 68 MG ~~LOC~~ IMPL
68.0000 mg | DRUG_IMPLANT | Freq: Once | SUBCUTANEOUS | Status: AC
  Administered 2019-07-22: 10:00:00 68 mg via SUBCUTANEOUS
  Filled 2019-07-22: qty 1

## 2019-07-22 MED ORDER — NIFEDIPINE ER 30 MG PO TB24: 30.0000 mg | ORAL_TABLET | Freq: Every day | ORAL | 1 refills | Status: DC

## 2019-07-22 NOTE — Discharge Summary (Signed)
Postpartum Discharge Summary  Date of Service updated     Patient Name: Ariana [name] DOB: [birth year] MRN: [id number]  Date of admission: 07/20/2019 Delivering Provider: Gladys Damme   Date of discharge: 07/22/2019  Admitting diagnosis: Normal labor [O80, Z37.9] Intrauterine pregnancy: [redacted]w[redacted]d    Secondary diagnosis:  Active Problems:   Normal labor  Additional problems: none     Discharge diagnosis: Term Pregnancy Delivered                                                                                                Post partum procedures:Nexplanon placed inpatient  Augmentation: AROM  Complications: None  Hospital course:  Onset of Labor With Vaginal Delivery     34y.o. yo G5P5005 at 485w0das admitted in Active Labor on 07/20/2019. Patient had an uncomplicated labor course as follows:  Membrane Rupture Time/Date: 12:16 AM ,07/21/2019   Intrapartum Procedures: Episiotomy: None [1]                                         Lacerations:  None [1]  Patient had a delivery of a Viable infant. 07/21/2019  Information for the patient's newborn:  HeSkila, Rollinsirl RoDezirae0[[id number]]Delivery Method: Vaginal, Spontaneous(Filed from Delivery Summary)     Pateint had an uncomplicated postpartum course.  She is ambulating, tolerating a regular diet, passing flatus, and urinating well. Patient is discharged home in stable condition on 07/22/19.  Delivery time: 12:19 AM    Magnesium Sulfate received: No BMZ received: No Rhophylac:No MMR:No Transfusion:No  Physical exam  Vitals:   07/21/19 2157 07/22/19 0534 07/22/19 1010 07/22/19 1451  BP: (!) 117/91 101/72 (!) 121/91 (!) 129/94  Pulse: 94 82 92 (!) 105  Resp: _0 Temp: 98.6 F (37 C) 97.7 F (36.5 C)  98 F (36.7 C)  TempSrc: Oral Oral  Oral  SpO2:    100%  Weight:      Height:       General: alert, cooperative and no distress Lochia: appropriate Uterine Fundus: firm Incision: N/A DVT Evaluation: No evidence of  DVT seen on physical exam. Labs: Lab Results  Component Value Date   WBC 7.0 07/22/2019   HGB 8.6 (L) 07/22/2019   HCT 27.4 (L) 07/22/2019   MCV 90.7 07/22/2019   PLT 226 07/22/2019   CMP Latest Ref Rng & Units 07/22/2019  Glucose 70 - 99 mg/dL 131(H)  BUN 6 - 20 mg/dL 9  Creatinine 0.44 - 1.00 mg/dL 0.77  Sodium 135 - 145 mmol/L 135  Potassium 3.5 - 5.1 mmol/L 3.4(L)  Chloride 98 - 111 mmol/L 101  CO2 22 - 32 mmol/L 23  Calcium 8.9 - 10.3 mg/dL 8.7(L)  Total Protein 6.5 - 8.1 g/dL 5.3(L)  Total Bilirubin 0.3 - 1.2 mg/dL 1.0  Alkaline Phos 38 - 126 U/L 96  AST 15 - 41 U/L 24  ALT 0 - 44 U/L 20    Discharge instruction: per After Visit Summary and "  Baby and Me Booklet".  After visit meds:  Allergies as of 07/22/2019      Reactions   5-alpha Reductase Inhibitors Other (See Comments)   Unknown reaction      Medication List    TAKE these medications   acetaminophen 325 MG tablet Commonly known as: Tylenol Take 2 tablets (650 mg total) by mouth every 4 (four) hours as needed (for pain scale < 4). What changed:   medication strength  how much to take  when to take this  reasons to take this   [place] 1 Device by Does not apply route daily.   glycopyrrolate 2 MG tablet Commonly known as: ROBINUL Take 1 tablet (2 mg total) by mouth 3 (three) times daily as needed.   Misc. Devices Misc Dispense one maternity belt for patient   NIFEdipine 30 MG 24 hr tablet Commonly known as: ADALAT CC Take 1 tablet (30 mg total) by mouth daily. Start taking on: July 23, 2019   ondansetron 4 MG disintegrating tablet Commonly known as: ZOFRAN-ODT DISSOLVE 1 TABLET(4 MG) ON THE TONGUE EVERY 6 HOURS AS NEEDED FOR NAUSEA What changed:   how much to take  how to take this  when to take this  reasons to take this   Prenate Pixie 10-0.6-0.4-200 MG Caps Take 1 capsule by mouth daily.       Diet: routine diet  Activity: Advance as  tolerated. Pelvic rest for 6 weeks.   Outpatient follow up:4 weeks with blood pressure check in 1 week; message sent to [facility] to schedule on 07-22-2019 Follow up Appt: Future Appointments  Date Time Provider [place]  08/24/2019 10:00 AM Constant, Peggy, MD [place] None   Follow up Visit:    Please schedule this patient for Postpartum visit in: 4 weeks with the following provider: Any provider Virtual For C/S patients schedule nurse incision check in weeks 2 weeks: no Low risk pregnancy complicated by: nothing Delivery mode:  SVD Anticipated Birth Control:  PP Nexplanon placed PP Procedures needed: NA  Schedule Integrated [place] visit: no   Message sent to [facility] to have patient come in for Blood pressure check in one week due to elevated blood pressures.   Newborn Data: Live born female  Birth Weight: 6 lb 12.3 oz (3070 g) APGAR: 9, 9  Newborn Delivery   Birth date/time: 07/21/2019 00:19:00 Delivery type: Vaginal, Spontaneous      Baby Feeding: Bottle and Breast Disposition:home with mother   07/22/2019 Starr Lake, CNM

## 2019-07-22 NOTE — Procedures (Signed)

## 2019-07-22 NOTE — Lactation Note (Signed)
This note was copied from a baby's chart. Lactation Consultation Note  Patient Name: Ariana [name] [id number]'[id number] Date: 07/22/2019 Reason for consult: Follow-up assessment Mom being d/c.  Baby Ariana 41 hours.  Mom reports she is trying to do more breast than formula.  Mom did exclusively bf her first two that were born in [place].  Mom reports nipples are sore.  Small cracks noted on the tips of moms nipples. Showed mom how to hand express/pat expressed mothers milk on nipples and air dry. Asked mom about pumping and offering breastmilk in bottles.  Mom reports she would if she had a pump.  Gave mom manual harmony pump.  Showed her how to use and clean it and gave 27 mm flange which looks like it first appropriately at this time. Urged mom to call lactation as needed.  Mom has resource lactation list for d/c.  Maternal Data Formula Feeding for Exclusion: Yes Reason for exclusion: Mother's choice to formula and breast feed on admission Has patient been taught Hand Expression?: Yes  Feeding Feeding Type: Breast Fed Nipple Type: Slow - flow  LATCH Score Latch: Grasps breast easily, tongue down, lips flanged, rhythmical sucking.  Audible Swallowing: A few with stimulation  Type of Nipple: Everted at rest and after stimulation  Comfort (Breast/Nipple): Filling, red/small blisters or bruises, mild/mod discomfort  Hold (Positioning): Assistance needed to correctly position infant at breast and maintain latch.  LATCH Score: 7  Interventions Interventions: Assisted with latch;Hand express;Expressed milk  Lactation Tools Discussed/Used     Consult Status Consult Status: Complete Date: 07/22/19 Follow-up type: Call as needed    [facility] 07/22/2019, 6:24 PM

## 2019-07-22 NOTE — Progress Notes (Addendum)
Post Partum Day 1 Subjective: no complaints, up ad lib, voiding, tolerating PO and + flatus  Objective: Blood pressure 101/72, pulse 82, temperature 97.7 F (36.5 C), temperature source Oral, resp. rate 18, height 5\' 4"  (1.626 m), weight 93.5 kg, last menstrual period 09/29/2018, SpO2 100 %, unknown if currently breastfeeding.  Physical Exam:  General: alert, cooperative, appears stated age and no distress Lochia: appropriate Uterine Fundus: firm Incision: n/a DVT Evaluation: No evidence of DVT seen on physical exam. No cords or calf tenderness. No significant calf/ankle edema.  Recent Labs    07/20/19 2035  HGB 11.2*  HCT 36.3    Assessment/Plan: Contraception plan is for nexplanon, to be placed today.   Patient may opt to go home today as infant was born just after midnight, pending approval by pediatrician. Patient has had mildly elevated BP's to 130-140s/80-90s, would likely benefit from anti-hypertensives. Will start Norvasc 5mg  to follow up in clinic.    LOS: 2 days   2036 07/22/2019, 7:20 AM   I confirm that I have verified the information documented in the resident's note and that I have also personally reperformed the history, physical exam and all medical decision making activities of this service and have verified that all service and findings are accurately documented in this student's note.   Nexplanon placed; see procedure note.  -will send message to clinic to make sure she has follow up BP check in one week.  -Note above says Norvasc to be started but was switched to Nifedipine 30 XL -CBC and CMP are normal -Patient may be staying another day, but would like to go home if baby is stable. Rn will update CNM later today.   Shirlean Mylar, CNM 07/22/2019 10:31 AM

## 2019-08-01 ENCOUNTER — Emergency Department (HOSPITAL_COMMUNITY)
Admission: EM | Admit: 2019-08-01 | Discharge: 2019-08-01 | Disposition: A | Discharge status: Home / Self Care | Payer: Medicaid Other | Attending: Emergency Medicine | Admitting: Emergency Medicine

## 2019-08-01 ENCOUNTER — Other Ambulatory Visit: Payer: Self-pay

## 2019-08-01 ENCOUNTER — Emergency Department (HOSPITAL_COMMUNITY): Payer: Medicaid Other

## 2019-08-01 DIAGNOSIS — R1011 Right upper quadrant pain: Secondary | ICD-10-CM

## 2019-08-01 DIAGNOSIS — R1013 Epigastric pain: Secondary | ICD-10-CM

## 2019-08-01 DIAGNOSIS — Z79899 Other long term (current) drug therapy: Secondary | ICD-10-CM | POA: Insufficient documentation

## 2019-08-01 DIAGNOSIS — R1084 Generalized abdominal pain: Secondary | ICD-10-CM | POA: Diagnosis not present

## 2019-08-01 DIAGNOSIS — R52 Pain, unspecified: Secondary | ICD-10-CM | POA: Diagnosis not present

## 2019-08-01 DIAGNOSIS — R109 Unspecified abdominal pain: Secondary | ICD-10-CM | POA: Diagnosis not present

## 2019-08-01 DIAGNOSIS — R0689 Other abnormalities of breathing: Secondary | ICD-10-CM | POA: Diagnosis not present

## 2019-08-01 LAB — COMPREHENSIVE METABOLIC PANEL
ALT: 27 U/L (ref 0–44)
AST: 18 U/L (ref 15–41)
Albumin: 2.6 g/dL — ABNORMAL LOW (ref 3.5–5.0)
Alkaline Phosphatase: 61 U/L (ref 38–126)
Anion gap: 6 (ref 5–15)
BUN: 10 mg/dL (ref 6–20)
CO2: 18 mmol/L — ABNORMAL LOW (ref 22–32)
Calcium: 6.6 mg/dL — ABNORMAL LOW (ref 8.9–10.3)
Chloride: 118 mmol/L — ABNORMAL HIGH (ref 98–111)
Creatinine, Ser: 0.58 mg/dL (ref 0.44–1.00)
GFR calc Af Amer: >60 mL/min (ref >60)
GFR calc non Af Amer: >60 mL/min (ref >60)
Glucose, Bld: 67 mg/dL — ABNORMAL LOW (ref 70–99)
Potassium: 3 mmol/L — ABNORMAL LOW (ref 3.5–5.1)
Sodium: 142 mmol/L (ref 135–145)
Total Bilirubin: 0.8 mg/dL (ref 0.3–1.2)
Total Protein: 5 g/dL — ABNORMAL LOW (ref 6.5–8.1)

## 2019-08-01 LAB — MAGNESIUM: Magnesium: 1.5 mg/dL — ABNORMAL LOW (ref 1.7–2.4)

## 2019-08-01 LAB — URINALYSIS, ROUTINE W REFLEX MICROSCOPIC
Bacteria, UA: NONE SEEN
Bilirubin Urine: NEGATIVE
Glucose, UA: NEGATIVE mg/dL
Ketones, ur: NEGATIVE mg/dL
Nitrite: NEGATIVE
Protein, ur: NEGATIVE mg/dL
Specific Gravity, Urine: 1.006 (ref 1.005–1.030)
pH: 8 (ref 5.0–8.0)

## 2019-08-01 LAB — CBC WITH DIFFERENTIAL/PLATELET
Abs Immature Granulocytes: 0.01 10*3/uL (ref 0.00–0.07)
Basophils Absolute: 0 10*3/uL (ref 0.0–0.1)
Basophils Relative: 0 %
Eosinophils Absolute: 0.3 10*3/uL (ref 0.0–0.5)
Eosinophils Relative: 5 %
HCT: 33.6 % — ABNORMAL LOW (ref 36.0–46.0)
Hemoglobin: 10.2 g/dL — ABNORMAL LOW (ref 12.0–15.0)
Immature Granulocytes: 0 %
Lymphocytes Relative: 24 %
Lymphs Abs: 1.3 10*3/uL (ref 0.7–4.0)
MCH: 27.3 pg (ref 26.0–34.0)
MCHC: 30.4 g/dL (ref 30.0–36.0)
MCV: 90.1 fL (ref 80.0–100.0)
Monocytes Absolute: 0.3 10*3/uL (ref 0.1–1.0)
Monocytes Relative: 6 %
Neutro Abs: 3.4 10*3/uL (ref 1.7–7.7)
Neutrophils Relative %: 65 %
Platelets: 411 10*3/uL — ABNORMAL HIGH (ref 150–400)
RBC: 3.73 MIL/uL — ABNORMAL LOW (ref 3.87–5.11)
RDW: 13.2 % (ref 11.5–15.5)
WBC: 5.3 10*3/uL (ref 4.0–10.5)
nRBC: 0 % (ref 0.0–0.2)

## 2019-08-01 LAB — PROTIME-INR
INR: 1 (ref 0.8–1.2)
Prothrombin Time: 12.8 seconds (ref 11.4–15.2)

## 2019-08-01 LAB — LACTATE DEHYDROGENASE: LDH: 166 U/L (ref 98–192)

## 2019-08-01 LAB — CREATININE, URINE, RANDOM: Creatinine, Urine: 47.43 mg/dL

## 2019-08-01 LAB — PROTEIN, URINE, RANDOM: Total Protein, Urine: <6 mg/dL

## 2019-08-01 LAB — LIPASE, BLOOD: Lipase: 23 U/L (ref 11–51)

## 2019-08-01 MED ORDER — IOHEXOL 300 MG/ML  SOLN
100.0000 mL | Freq: Once | INTRAMUSCULAR | Status: AC | PRN
  Administered 2019-08-01: 100 mL via INTRAVENOUS

## 2019-08-01 MED ORDER — MAGNESIUM SULFATE 2 GM/50ML IV SOLN
2.0000 g | Freq: Once | INTRAVENOUS | Status: AC
  Administered 2019-08-01: 2 g via INTRAVENOUS
  Filled 2019-08-01: qty 50

## 2019-08-01 MED ORDER — FENTANYL CITRATE (PF) 100 MCG/2ML IJ SOLN
50.0000 ug | Freq: Once | INTRAMUSCULAR | Status: AC
  Administered 2019-08-01: 14:00:00 50 ug via INTRAVENOUS
  Filled 2019-08-01: qty 2

## 2019-08-01 MED ORDER — SODIUM CHLORIDE (PF) 0.9 % IJ SOLN
INTRAMUSCULAR | Status: AC
  Filled 2019-08-01: qty 50

## 2019-08-01 MED ORDER — POTASSIUM CHLORIDE CRYS ER 20 MEQ PO TBCR
40.0000 meq | EXTENDED_RELEASE_TABLET | Freq: Once | ORAL | Status: AC
  Administered 2019-08-01: 40 meq via ORAL
  Filled 2019-08-01: qty 2

## 2019-08-01 NOTE — ED Notes (Signed)
An After Visit Summary was printed and given to the patient. Discharge instructions given and no further questions at this time. Pt states she is calling family for ride home.

## 2019-08-01 NOTE — ED Provider Notes (Signed)
Patient care assumed at 1630. Patient 10 days postpartum following vaginally delivery here for evaluation of right upper quadrant and epigastric pain. CT abdomen pelvis pending at time of patient transfer. CT is negative for acute abnormality. Patient states that her pain is improved. She does have mild epigastric tenderness on assessment without peritoneal findings. She reports that she has minimal lochia, no lower abdominal pain. She is currently breast-feeding. Discussed with patient unclear source of symptoms. Discussed importance of OB/GYN follow-up as well as return precautions.   Tilden Fossa, MD 08/01/19 1730

## 2019-08-01 NOTE — ED Triage Notes (Signed)
Per EMS, patient has abd pain starting this morning RUQ, progressively worse throughout day. Patient had vaginal delivery 10 days ago w/ no complications.

## 2019-08-01 NOTE — Discharge Instructions (Addendum)
You may take Tylenol, available over-the-counter according to label instructions as needed for pain.

## 2019-08-01 NOTE — ED Provider Notes (Signed)
[facility] DEPT Provider Note   CSN: [id number] Arrival date & time: 08/01/19  1309     History Chief Complaint  Patient presents with  . Abdominal Pain    Ariana [name] is a 34 y.o. female.  Presents to the emergency department with chief complaint right upper quadrant pain.  Patient recent vaginal delivery 10 days ago, reports no complications with delivery, no complications with pregnancy.  Pain started this morning, worse in her right upper abdomen.  No pain in lower abdomen, no pelvic pain, no new vaginal bleeding, no new vaginal discharge.  No fevers.  No vomiting.  HPI     Past Medical History:  Diagnosis Date  . Alpha thalassemia silent carrier   . Gestational diabetes     Patient Active Problem List   Diagnosis Date Noted  . Normal labor 07/20/2019  . Group B streptococcal bacteriuria 02/01/2019  . Alpha thalassemia silent carrier 01/22/2019  . Supervision of other normal pregnancy, antepartum 01/06/2019  . Nausea and vomiting during pregnancy 01/06/2019  . History of gestational diabetes in prior pregnancy, currently pregnant 01/06/2019    No past surgical history on file.   OB History    Gravida  5   Para  5   Term  5   Preterm      AB      Living  5     SAB      TAB      Ectopic      Multiple  0   Live Births  5           Family History  Problem Relation Age of Onset  . Healthy Mother     Social History   Tobacco Use  . Smoking status: Never Smoker  . Smokeless tobacco: Never Used  Substance Use Topics  . Alcohol use: No  . Drug use: No    Home Medications Prior to Admission medications   Medication Sig Start Date End Date Taking? Authorizing Provider  acetaminophen (TYLENOL) 325 MG tablet Take 2 tablets (650 mg total) by mouth every 4 (four) hours as needed (for pain scale < 4). 07/22/19   Ardean Larsen, Mervyn Skeeters, CNM  Elastic Bandages & Supports (COMFORT FIT MATERNITY SUPP MED) MISC 1  Device by Does not apply route daily. 04/11/19   Leftwich-Kirby, Kathie Dike, CNM  glycopyrrolate (ROBINUL) 2 MG tablet Take 1 tablet (2 mg total) by mouth 3 (three) times daily as needed. 04/11/19   Leftwich-Kirby, Kathie Dike, CNM  Misc. Devices MISC Dispense one maternity belt for patient 04/25/19   Sloan Leiter, MD  NIFEdipine (ADALAT CC) 30 MG 24 hr tablet Take 1 tablet (30 mg total) by mouth daily. 07/23/19   Starr Lake, CNM  ondansetron (ZOFRAN-ODT) 4 MG disintegrating tablet DISSOLVE 1 TABLET(4 MG) ON THE TONGUE EVERY 6 HOURS AS NEEDED FOR NAUSEA Patient taking differently: Take 4 mg by mouth every 6 (six) hours as needed for nausea or vomiting. DISSOLVE 1 TABLET(4 MG) ON THE TONGUE EVERY 6 HOURS AS NEEDED FOR NAUSEA 07/07/19   Rasch, Anderson Malta I, NP  Prenat-FeAsp-Meth-FA-DHA w/o A (PRENATE PIXIE) 10-0.6-0.4-200 MG CAPS Take 1 capsule by mouth daily. 07/07/19   Rasch, Anderson Malta I, NP    Allergies    5-alpha reductase inhibitors  Review of Systems   Review of Systems  Constitutional: Negative for chills and fever.  HENT: Negative for ear pain and sore throat.   Eyes: Negative for pain and visual disturbance.  Respiratory: Negative for cough and shortness of breath.   Cardiovascular: Negative for chest pain and palpitations.  Gastrointestinal: Positive for abdominal pain. Negative for vomiting.  Genitourinary: Negative for dysuria and hematuria.  Musculoskeletal: Negative for arthralgias and back pain.  Skin: Negative for color change and rash.  Neurological: Negative for seizures and syncope.  All other systems reviewed and are negative.   Physical Exam Updated Vital Signs BP 131/87   Pulse 81   Temp 98.7 F (37.1 C) (Oral)   Resp 18   Ht 5\' 3"  (1.6 m)   Wt 97.5 kg   LMP 09/29/2018   SpO2 100%   BMI 38.09 kg/m   Physical Exam Vitals and nursing note reviewed.  Constitutional:      General: She is not in acute distress.    Appearance: She is well-developed.    HENT:     Head: Normocephalic and atraumatic.  Eyes:     Conjunctiva/sclera: Conjunctivae normal.  Cardiovascular:     Rate and Rhythm: Normal rate and regular rhythm.     Heart sounds: No murmur.  Pulmonary:     Effort: Pulmonary effort is normal. No respiratory distress.     Breath sounds: Normal breath sounds.  Abdominal:     Palpations: Abdomen is soft.     Tenderness: There is no abdominal tenderness.     Comments: Tenderness palpation right upper quadrant, epigastrium  Musculoskeletal:     Cervical back: Neck supple.  Skin:    General: Skin is warm and dry.  Neurological:     Mental Status: She is alert.     ED Results / Procedures / Treatments   Labs (all labs ordered are listed, but only abnormal results are displayed) Labs Reviewed  CBC WITH DIFFERENTIAL/PLATELET - Abnormal; Notable for the following components:      Result Value   RBC 3.73 (*)    Hemoglobin 10.2 (*)    HCT 33.6 (*)    Platelets 411 (*)    All other components within normal limits  URINALYSIS, ROUTINE W REFLEX MICROSCOPIC - Abnormal; Notable for the following components:   Color, Urine STRAW (*)    Hgb urine dipstick LARGE (*)    Leukocytes,Ua TRACE (*)    All other components within normal limits  COMPREHENSIVE METABOLIC PANEL - Abnormal; Notable for the following components:   Potassium 3.0 (*)    Chloride 118 (*)    CO2 18 (*)    Glucose, Bld 67 (*)    Calcium 6.6 (*)    Total Protein 5.0 (*)    Albumin 2.6 (*)    All other components within normal limits  MAGNESIUM - Abnormal; Notable for the following components:   Magnesium 1.5 (*)    All other components within normal limits  CREATININE, URINE, RANDOM  PROTEIN, URINE, RANDOM  PROTIME-INR  LACTATE DEHYDROGENASE  LIPASE, BLOOD    EKG None  Radiology 10/01/2018 Abdomen Limited  Result Date: 08/01/2019 CLINICAL DATA:  Abdominal pain EXAM: ULTRASOUND ABDOMEN LIMITED RIGHT UPPER QUADRANT COMPARISON:  None. FINDINGS: Gallbladder: No  gallstones or wall thickening visualized. There is no pericholecystic fluid. No sonographic Murphy sign noted by sonographer. Common bile duct: Diameter: 3 mm. No intrahepatic or extrahepatic biliary duct dilatation. Liver: No focal lesion identified. Within normal limits in parenchymal echogenicity. Portal vein is patent on color Doppler imaging with normal direction of blood flow towards the liver. Other: None. IMPRESSION: Study within normal limits. Electronically Signed   By: 08/03/2019 III  M.D.   On: 08/01/2019 14:37    Procedures Procedures (including critical care time)  Medications Ordered in ED Medications  sodium chloride (PF) 0.9 % injection (has no administration in time range)  fentaNYL (SUBLIMAZE) injection 50 mcg (50 mcg Intravenous Given 08/01/19 1411)  magnesium sulfate IVPB 2 g 50 mL (2 g Intravenous New Bag/Given 08/01/19 1514)  potassium chloride SA (KLOR-CON) CR tablet 40 mEq (40 mEq Oral Given 08/01/19 1515)  iohexol (OMNIPAQUE) 300 MG/ML solution 100 mL (100 mLs Intravenous Contrast Given 08/01/19 1634)    ED Course  I have reviewed the triage vital signs and the nursing notes.  Pertinent labs & imaging results that were available during my care of the patient were reviewed by me and considered in my medical decision making (see chart for details).    MDM Rules/Calculators/A&P                      34 year old lady recent vaginal delivery presented to ER with new onset of right upper quadrant pain.  Noted some tenderness in her epigastric area and right upper quadrant but otherwise patient was well-appearing.  BP within normal limits.  Given location of pain, checked HELLP labs - normal LFTs, normal LDH.  Right upper quad ultrasound negative for acute biliary pathology.  Obtained CT scan to further evaluate.  While awaiting CT results, patient signed out to Dr. Madilyn Hook pending CT, reassessment.   Final Clinical Impression(s) / ED Diagnoses Final diagnoses:  Right  upper quadrant abdominal pain    Rx / DC Orders ED Discharge Orders    None       Milagros Loll, MD 08/01/19 1655

## 2019-08-24 ENCOUNTER — Telehealth: Payer: Medicaid Other | Admitting: Obstetrics and Gynecology

## 2019-08-24 NOTE — Progress Notes (Signed)
Unable to reach patient for postpartum visit. Will reschedule 

## 2019-11-04 ENCOUNTER — Other Ambulatory Visit: Payer: Self-pay

## 2019-11-04 ENCOUNTER — Encounter (HOSPITAL_COMMUNITY): Payer: Self-pay | Admitting: Obstetrics and Gynecology

## 2019-11-04 ENCOUNTER — Inpatient Hospital Stay (HOSPITAL_COMMUNITY)
Admission: AD | Admit: 2019-11-04 | Discharge: 2019-11-05 | Disposition: A | Discharge status: Home / Self Care | Payer: Medicaid Other | Attending: Emergency Medicine | Admitting: Emergency Medicine

## 2019-11-04 DIAGNOSIS — R1032 Left lower quadrant pain: Secondary | ICD-10-CM | POA: Diagnosis not present

## 2019-11-04 DIAGNOSIS — R1012 Left upper quadrant pain: Secondary | ICD-10-CM | POA: Diagnosis not present

## 2019-11-04 DIAGNOSIS — R109 Unspecified abdominal pain: Secondary | ICD-10-CM | POA: Diagnosis present

## 2019-11-04 DIAGNOSIS — K439 Ventral hernia without obstruction or gangrene: Secondary | ICD-10-CM

## 2019-11-04 DIAGNOSIS — K429 Umbilical hernia without obstruction or gangrene: Secondary | ICD-10-CM | POA: Diagnosis not present

## 2019-11-04 DIAGNOSIS — K469 Unspecified abdominal hernia without obstruction or gangrene: Secondary | ICD-10-CM | POA: Diagnosis not present

## 2019-11-04 LAB — CBC
HCT: 36.4 % (ref 36.0–46.0)
Hemoglobin: 11.1 g/dL — ABNORMAL LOW (ref 12.0–15.0)
MCH: 27.2 pg (ref 26.0–34.0)
MCHC: 30.5 g/dL (ref 30.0–36.0)
MCV: 89.2 fL (ref 80.0–100.0)
Platelets: 237 10*3/uL (ref 150–400)
RBC: 4.08 MIL/uL (ref 3.87–5.11)
RDW: 13.9 % (ref 11.5–15.5)
WBC: 6.5 10*3/uL (ref 4.0–10.5)
nRBC: 0 % (ref 0.0–0.2)

## 2019-11-04 LAB — URINALYSIS, ROUTINE W REFLEX MICROSCOPIC
Bilirubin Urine: NEGATIVE
Glucose, UA: NEGATIVE mg/dL
Hgb urine dipstick: NEGATIVE
Ketones, ur: NEGATIVE mg/dL
Leukocytes,Ua: NEGATIVE
Nitrite: NEGATIVE
Protein, ur: NEGATIVE mg/dL
Specific Gravity, Urine: 1.026 (ref 1.005–1.030)
pH: 5 (ref 5.0–8.0)

## 2019-11-04 LAB — COMPREHENSIVE METABOLIC PANEL
ALT: 48 U/L — ABNORMAL HIGH (ref 0–44)
AST: 32 U/L (ref 15–41)
Albumin: 3.9 g/dL (ref 3.5–5.0)
Alkaline Phosphatase: 84 U/L (ref 38–126)
Anion gap: 9 (ref 5–15)
BUN: 18 mg/dL (ref 6–20)
CO2: 23 mmol/L (ref 22–32)
Calcium: 9.1 mg/dL (ref 8.9–10.3)
Chloride: 108 mmol/L (ref 98–111)
Creatinine, Ser: 0.97 mg/dL (ref 0.44–1.00)
GFR calc Af Amer: >60 mL/min (ref >60)
GFR calc non Af Amer: >60 mL/min (ref >60)
Glucose, Bld: 94 mg/dL (ref 70–99)
Potassium: 4 mmol/L (ref 3.5–5.1)
Sodium: 140 mmol/L (ref 135–145)
Total Bilirubin: 1.3 mg/dL — ABNORMAL HIGH (ref 0.3–1.2)
Total Protein: 7.1 g/dL (ref 6.5–8.1)

## 2019-11-04 LAB — I-STAT BETA HCG BLOOD, ED (MC, WL, AP ONLY): I-stat hCG, quantitative: <5 m[IU]/mL (ref <5)

## 2019-11-04 LAB — PREGNANCY, URINE: Preg Test, Ur: NEGATIVE

## 2019-11-04 LAB — POCT PREGNANCY, URINE: Preg Test, Ur: NEGATIVE

## 2019-11-04 LAB — LIPASE, BLOOD: Lipase: 46 U/L (ref 11–51)

## 2019-11-04 MED ORDER — SODIUM CHLORIDE 0.9% FLUSH: 3.0000 mL | Freq: Once | INTRAVENOUS | Status: DC

## 2019-11-04 MED ORDER — LIDOCAINE 5 % EX PTCH
2.0000 | MEDICATED_PATCH | CUTANEOUS | Status: DC
  Administered 2019-11-05: 2 via TRANSDERMAL
  Filled 2019-11-04: qty 2

## 2019-11-04 MED ORDER — KETOROLAC TROMETHAMINE 60 MG/2ML IM SOLN
30.0000 mg | Freq: Once | INTRAMUSCULAR | Status: AC
  Administered 2019-11-05: 30 mg via INTRAMUSCULAR
  Filled 2019-11-04: qty 2

## 2019-11-04 NOTE — Progress Notes (Signed)
S [name] is a 34 y.o. [id number] non-pregnant female who presents to MAU today with complaint of LLQ abdominal pain. Reports it has been present since delivery of last child. Has been able to take normal PO, no n/v.   O BP 123/87 (BP Location: Left Arm)   Pulse 85   Temp 98.5 F (36.9 C) (Oral)   Resp 20   SpO2 100% Comment: room air Physical Exam  Vitals reviewed. Constitutional: She appears well-developed and well-nourished.  uncomfortable  Eyes: No scleral icterus.  Respiratory: Effort normal. No respiratory distress.  Neurological: She is alert.  Skin: Skin is warm and dry. She is not diaphoretic.  Psychiatric: She has a normal mood and affect.    A UPT negative Non pregnant female with acute on chronic LLQ abdominal pain Vital signs unremarkable Medical screening exam complete  P Transfered to [facility] for further evaluation  Venora Maples, MD 11/04/2019 7:16 PM

## 2019-11-04 NOTE — MAU Note (Signed)
Ariana [name] is a 34 y.o.  here in MAU reporting:  +lower left abdominal pain Sharp Constant  recent delivery on 07/21/19 Onset of complaint: since sunday Pain score: 10/10 Tried tylenol at 1030 this morning with no relief.  Vitals:   11/04/19 1901  BP: 123/87  Pulse: 85  Resp: 20  Temp: 98.5 F (36.9 C)  SpO2: 100%

## 2019-11-04 NOTE — ED Triage Notes (Signed)
Pt reports that she has been having L sided pain since giving birth in Feb, pain comes and goes, denies n/v/d.

## 2019-11-05 ENCOUNTER — Inpatient Hospital Stay (HOSPITAL_COMMUNITY): Payer: Medicaid Other

## 2019-11-05 ENCOUNTER — Encounter (HOSPITAL_COMMUNITY): Payer: Self-pay | Admitting: Obstetrics and Gynecology

## 2019-11-05 DIAGNOSIS — K429 Umbilical hernia without obstruction or gangrene: Secondary | ICD-10-CM | POA: Diagnosis not present

## 2019-11-05 MED ORDER — LIDOCAINE 5 % EX PTCH: 1.0000 | MEDICATED_PATCH | CUTANEOUS | 0 refills | Status: DC

## 2019-11-05 MED ORDER — IBUPROFEN 800 MG PO TABS: 800.0000 mg | ORAL_TABLET | Freq: Three times a day (TID) | ORAL | 0 refills | Status: DC

## 2019-11-05 NOTE — ED Notes (Signed)
Discharge instructions discussed with pt. Pt verbalized understanding. Pt stable and ambulatory. No signature pad available. 

## 2019-11-05 NOTE — ED Provider Notes (Addendum)
[facility] EMERGENCY DEPARTMENT Provider Note   CSN: [id number] Arrival date & time: 11/04/19  1840     History Chief Complaint  Patient presents with  . Abdominal Pain    Ariana [name] is a 34 y.o. female.  The history is provided by the patient.  Abdominal Pain Pain location:  LLQ and LUQ Pain quality: aching   Pain radiates to:  Does not radiate Pain severity:  Severe Onset quality:  Gradual Duration:  14 weeks Timing:  Constant Progression:  Unchanged Chronicity:  Chronic Context comment:  Since child birth  Relieved by:  Nothing Worsened by:  Nothing Ineffective treatments:  None tried Associated symptoms: no anorexia, no belching, no chest pain, no chills, no constipation, no cough, no diarrhea, no dysuria, no fatigue, no fever, no flatus, no hematemesis, no hematochezia, no hematuria, no melena, no nausea, no shortness of breath, no sore throat, no vaginal bleeding, no vaginal discharge and no vomiting   Risk factors: no alcohol abuse        Past Medical History:  Diagnosis Date  . Alpha thalassemia silent carrier   . Gestational diabetes     Patient Active Problem List   Diagnosis Date Noted  . Normal labor 07/20/2019  . Group B streptococcal bacteriuria 02/01/2019  . Alpha thalassemia silent carrier 01/22/2019  . Supervision of other normal pregnancy, antepartum 01/06/2019  . Nausea and vomiting during pregnancy 01/06/2019  . History of gestational diabetes in prior pregnancy, currently pregnant 01/06/2019    History reviewed. No pertinent surgical history.   OB History    Gravida  5   Para  5   Term  5   Preterm      AB      Living  5     SAB      TAB      Ectopic      Multiple  0   Live Births  5           Family History  Problem Relation Age of Onset  . Healthy Mother     Social History   Tobacco Use  . Smoking status: Never Smoker  . Smokeless tobacco: Never Used  Substance Use Topics  .  Alcohol use: No  . Drug use: No    Home Medications Prior to Admission medications   Medication Sig Start Date End Date Taking? Authorizing Provider  acetaminophen (TYLENOL) 325 MG tablet Take 2 tablets (650 mg total) by mouth every 4 (four) hours as needed (for pain scale < 4). 07/22/19   Crisoforo Oxford, Charlesetta Garibaldi, CNM  Elastic Bandages & Supports (COMFORT FIT MATERNITY SUPP MED) MISC 1 Device by Does not apply route daily. 04/11/19   Leftwich-Kirby, Wilmer Floor, CNM  glycopyrrolate (ROBINUL) 2 MG tablet Take 1 tablet (2 mg total) by mouth 3 (three) times daily as needed. 04/11/19   Leftwich-Kirby, Wilmer Floor, CNM  Misc. Devices MISC Dispense one maternity belt for patient 04/25/19   Conan Bowens, MD  NIFEdipine (ADALAT CC) 30 MG 24 hr tablet Take 1 tablet (30 mg total) by mouth daily. 07/23/19   Marylene Land, CNM  ondansetron (ZOFRAN-ODT) 4 MG disintegrating tablet DISSOLVE 1 TABLET(4 MG) ON THE TONGUE EVERY 6 HOURS AS NEEDED FOR NAUSEA Patient taking differently: Take 4 mg by mouth every 6 (six) hours as needed for nausea or vomiting. DISSOLVE 1 TABLET(4 MG) ON THE TONGUE EVERY 6 HOURS AS NEEDED FOR NAUSEA 07/07/19   Rasch, Harolyn Rutherford, NP  Prenat-FeAsp-Meth-FA-DHA w/o A (PRENATE PIXIE) 10-0.6-0.4-200 MG CAPS Take 1 capsule by mouth daily. 07/07/19   Rasch, Victorino Dike I, NP    Allergies    5-alpha reductase inhibitors  Review of Systems   Review of Systems  Constitutional: Negative for chills, fatigue and fever.  HENT: Negative for sore throat.   Eyes: Negative for visual disturbance.  Respiratory: Negative for cough and shortness of breath.   Cardiovascular: Negative for chest pain.  Gastrointestinal: Positive for abdominal pain. Negative for anorexia, constipation, diarrhea, flatus, hematemesis, hematochezia, melena, nausea and vomiting.  Genitourinary: Negative for dysuria, hematuria, vaginal bleeding and vaginal discharge.  Musculoskeletal: Negative for arthralgias.  Skin:  Negative for color change.  Neurological: Negative for dizziness.  Psychiatric/Behavioral: Negative for agitation.  All other systems reviewed and are negative.   Physical Exam Updated Vital Signs BP 127/84   Pulse 70   Temp 98.4 F (36.9 C) (Oral)   Resp 16   SpO2 98%   Physical Exam Vitals and nursing note reviewed.  Constitutional:      General: She is not in acute distress.    Appearance: Normal appearance.  HENT:     Head: Normocephalic and atraumatic.     Nose: Nose normal.  Eyes:     Pupils: Pupils are equal, round, and reactive to light.  Cardiovascular:     Rate and Rhythm: Normal rate and regular rhythm.     Pulses: Normal pulses.     Heart sounds: Normal heart sounds.  Pulmonary:     Effort: Pulmonary effort is normal.     Breath sounds: Normal breath sounds.  Abdominal:     General: Abdomen is flat. Bowel sounds are normal. There is no distension.     Palpations: Abdomen is soft.     Tenderness: There is no guarding or rebound.     Hernia: No hernia is present.     Comments: LUQ tenderness  Musculoskeletal:        General: Normal range of motion.     Cervical back: Normal range of motion.  Skin:    General: Skin is warm and dry.     Capillary Refill: Capillary refill takes less than 2 seconds.  Neurological:     General: No focal deficit present.     Mental Status: She is alert and oriented to person, place, and time.  Psychiatric:        Mood and Affect: Mood normal.        Behavior: Behavior normal.     ED Results / Procedures / Treatments   Labs (all labs ordered are listed, but only abnormal results are displayed) Results for orders placed or performed during the hospital encounter of 11/04/19  Pregnancy, urine  Result Value Ref Range   Preg Test, Ur NEGATIVE NEGATIVE  Urinalysis, Routine w reflex microscopic  Result Value Ref Range   Color, Urine YELLOW YELLOW   APPearance CLEAR CLEAR   Specific Gravity, Urine 1.026 1.005 - 1.030   pH  5.0 5.0 - 8.0   Glucose, UA NEGATIVE NEGATIVE mg/dL   Hgb urine dipstick NEGATIVE NEGATIVE   Bilirubin Urine NEGATIVE NEGATIVE   Ketones, ur NEGATIVE NEGATIVE mg/dL   Protein, ur NEGATIVE NEGATIVE mg/dL   Nitrite NEGATIVE NEGATIVE   Leukocytes,Ua NEGATIVE NEGATIVE  Lipase, blood  Result Value Ref Range   Lipase 46 11 - 51 U/L  Comprehensive metabolic panel  Result Value Ref Range   Sodium 140 135 - 145 mmol/L   Potassium 4.0 3.5 -  5.1 mmol/L   Chloride 108 98 - 111 mmol/L   CO2 23 22 - 32 mmol/L   Glucose, Bld 94 70 - 99 mg/dL   BUN 18 6 - 20 mg/dL   Creatinine, Ser 1.610.97 0.44 - 1.00 mg/dL   Calcium 9.1 8.9 - 09.610.3 mg/dL   Total Protein 7.1 6.5 - 8.1 g/dL   Albumin 3.9 3.5 - 5.0 g/dL   AST 32 15 - 41 U/L   ALT 48 (H) 0 - 44 U/L   Alkaline Phosphatase 84 38 - 126 U/L   Total Bilirubin 1.3 (H) 0.3 - 1.2 mg/dL   GFR calc non Af Amer >60 >60 mL/min   GFR calc Af Amer >60 >60 mL/min   Anion gap 9 5 - 15  CBC  Result Value Ref Range   WBC 6.5 4.0 - 10.5 K/uL   RBC 4.08 3.87 - 5.11 MIL/uL   Hemoglobin 11.1 (L) 12.0 - 15.0 g/dL   HCT 04.536.4 40.936.0 - 81.146.0 %   MCV 89.2 80.0 - 100.0 fL   MCH 27.2 26.0 - 34.0 pg   MCHC 30.5 30.0 - 36.0 g/dL   RDW 91.413.9 78.211.5 - 95.615.5 %   Platelets 237 150 - 400 K/uL   nRBC 0.0 0.0 - 0.2 %  Pregnancy, urine POC  Result Value Ref Range   Preg Test, Ur NEGATIVE NEGATIVE  I-Stat beta hCG blood, ED  Result Value Ref Range   I-stat hCG, quantitative <5.0 <5 mIU/mL   Comment 3           CT Renal Stone Study  Result Date: 11/05/2019 CLINICAL DATA:  Pain. EXAM: CT ABDOMEN AND PELVIS WITHOUT CONTRAST TECHNIQUE: Multidetector CT imaging of the abdomen and pelvis was performed following the standard protocol without IV contrast. COMPARISON:  08/01/2019 FINDINGS: Lower chest: There are trace bilateral pleural effusions.The heart size is normal. Hepatobiliary: The liver is normal. Normal gallbladder.There is no biliary ductal dilation. Pancreas: Normal contours  without ductal dilatation. No peripancreatic fluid collection. Spleen: Unremarkable. Adrenals/Urinary Tract: --Adrenal glands: Unremarkable. --Right kidney/ureter: No hydronephrosis or radiopaque kidney stones. --Left kidney/ureter: No hydronephrosis or radiopaque kidney stones. --Urinary bladder: Unremarkable. Stomach/Bowel: --Stomach/Duodenum: No hiatal hernia or other gastric abnormality. Normal duodenal course and caliber. --Small bowel: Unremarkable. --Colon: Unremarkable. --Appendix: Normal. Vascular/Lymphatic: Normal course and caliber of the major abdominal vessels. --No retroperitoneal lymphadenopathy. --No mesenteric lymphadenopathy. --No pelvic or inguinal lymphadenopathy. Reproductive: Unremarkable Other: No ascites or free air. There is a fat containing ventral wall hernia superior to the umbilicus. There is some fat stranding involving the herniated fat. More inferiorly, there is a small fat containing umbilical hernia. Musculoskeletal. No acute displaced fractures. IMPRESSION: 1. Fat containing ventral wall hernia superior to the umbilicus with some fat stranding involving the herniated fat. This could indicate incarceration in the appropriate clinical setting. 2. Additional small fat containing umbilical hernia. 3. Trace bilateral pleural effusions. Electronically Signed   By: Katherine Mantlehristopher  Green M.D.   On: 11/05/2019 00:33    Radiology CT Renal Stone Study  Result Date: 11/05/2019 CLINICAL DATA:  Pain. EXAM: CT ABDOMEN AND PELVIS WITHOUT CONTRAST TECHNIQUE: Multidetector CT imaging of the abdomen and pelvis was performed following the standard protocol without IV contrast. COMPARISON:  08/01/2019 FINDINGS: Lower chest: There are trace bilateral pleural effusions.The heart size is normal. Hepatobiliary: The liver is normal. Normal gallbladder.There is no biliary ductal dilation. Pancreas: Normal contours without ductal dilatation. No peripancreatic fluid collection. Spleen: Unremarkable.  Adrenals/Urinary Tract: --Adrenal glands: Unremarkable. --Right kidney/ureter:  No hydronephrosis or radiopaque kidney stones. --Left kidney/ureter: No hydronephrosis or radiopaque kidney stones. --Urinary bladder: Unremarkable. Stomach/Bowel: --Stomach/Duodenum: No hiatal hernia or other gastric abnormality. Normal duodenal course and caliber. --Small bowel: Unremarkable. --Colon: Unremarkable. --Appendix: Normal. Vascular/Lymphatic: Normal course and caliber of the major abdominal vessels. --No retroperitoneal lymphadenopathy. --No mesenteric lymphadenopathy. --No pelvic or inguinal lymphadenopathy. Reproductive: Unremarkable Other: No ascites or free air. There is a fat containing ventral wall hernia superior to the umbilicus. There is some fat stranding involving the herniated fat. More inferiorly, there is a small fat containing umbilical hernia. Musculoskeletal. No acute displaced fractures. IMPRESSION: 1. Fat containing ventral wall hernia superior to the umbilicus with some fat stranding involving the herniated fat. This could indicate incarceration in the appropriate clinical setting. 2. Additional small fat containing umbilical hernia. 3. Trace bilateral pleural effusions. Electronically Signed   By: Constance Holster M.D.   On: 11/05/2019 00:33    Procedures Procedures (including critical care time)  Medications Ordered in ED Medications  sodium chloride flush (NS) 0.9 % injection 3 mL (3 mLs Intravenous Not Given 11/05/19 0017)  lidocaine (LIDODERM) 5 % 2 patch (2 patches Transdermal Patch Applied 11/05/19 0025)  ketorolac (TORADOL) injection 30 mg (30 mg Intramuscular Given 11/05/19 0023)    ED Course  I have reviewed the triage vital signs and the nursing notes.  Pertinent labs & imaging results that were available during my care of the patient were reviewed by me and considered in my medical decision making (see chart for details).    128 Case d/w General surgery, Dr. Donne Hazel  regarding the hernias.  NSAIDs and ice and will need to stop breastfeeding.  May need surgery electively at some point,  Follow up with [facility] as an outpatient.    Pain predates the hernias and was not seen on previous CT scan for same pain.  Her Left sided pain is something different and I believe it is abdominal wall pain, there is no tenderness at the hernia sites.  I have advised start NSAIDs, ice, and advise follow up with general surgery.  I have told the patient she must contact her pediatrician for advise on breastfeeding before starting her medication  Ariana [name] was evaluated in Emergency Department on 11/05/2019 for the symptoms described in the history of present illness. She was evaluated in the context of the global COVID-19 pandemic, which necessitated consideration that the patient might be at risk for infection with the SARS-CoV-2 virus that causes COVID-19. Institutional protocols and algorithms that pertain to the evaluation of patients at risk for COVID-19 are in a state of rapid change based on information released by regulatory bodies including the CDC and federal and state organizations. These policies and algorithms were followed during the patient's care in the ED.  Final Clinical Impression(s) / ED Diagnoses Return for intractable cough, coughing up blood,fevers >100.4 unrelieved by medication, shortness of breath, intractable vomiting, chest pain, shortness of breath, weakness,numbness, changes in speech, facial asymmetry,abdominal pain, passing out,Inability to tolerate liquids or food, cough, altered mental status or any concerns. No signs of systemic illness or infection. The patient is nontoxic-appearing on exam and vital signs are within normal limits.   I have reviewed the triage vital signs and the nursing notes. Pertinent labs &imaging results that were available during my care of the patient were reviewed by me and considered in my medical decision making (see  chart for details).After history, exam, and medical workup I feel the patient has beenappropriately medically  screened and is safe for discharge home. Pertinent diagnoses were discussed with the patient. Patient was given return precautions.   Jennavecia Schwier, MD 11/05/19 0130    Nicanor Alcon, Katelyn Broadnax, MD 11/05/19 1157

## 2021-01-24 ENCOUNTER — Other Ambulatory Visit: Payer: Self-pay

## 2021-01-24 ENCOUNTER — Ambulatory Visit (HOSPITAL_COMMUNITY)
Admission: EM | Admit: 2021-01-24 | Discharge: 2021-01-24 | Disposition: A | Discharge status: Home / Self Care | Payer: Medicaid Other | Attending: Internal Medicine | Admitting: Internal Medicine

## 2021-01-24 ENCOUNTER — Encounter (HOSPITAL_COMMUNITY): Payer: Self-pay | Admitting: Emergency Medicine

## 2021-01-24 DIAGNOSIS — N644 Mastodynia: Secondary | ICD-10-CM | POA: Insufficient documentation

## 2021-01-24 DIAGNOSIS — R102 Pelvic and perineal pain: Secondary | ICD-10-CM | POA: Diagnosis not present

## 2021-01-24 DIAGNOSIS — N926 Irregular menstruation, unspecified: Secondary | ICD-10-CM | POA: Diagnosis not present

## 2021-01-24 DIAGNOSIS — Z3202 Encounter for pregnancy test, result negative: Secondary | ICD-10-CM | POA: Diagnosis not present

## 2021-01-24 LAB — POCT URINALYSIS DIPSTICK, ED / UC
Bilirubin Urine: NEGATIVE
Glucose, UA: NEGATIVE mg/dL
Hgb urine dipstick: NEGATIVE
Ketones, ur: NEGATIVE mg/dL
Leukocytes,Ua: NEGATIVE
Nitrite: NEGATIVE
Protein, ur: NEGATIVE mg/dL
Specific Gravity, Urine: 1.025 (ref 1.005–1.030)
Urobilinogen, UA: 0.2 mg/dL (ref 0.0–1.0)
pH: 5 (ref 5.0–8.0)

## 2021-01-24 LAB — POC URINE PREG, ED: Preg Test, Ur: NEGATIVE

## 2021-01-24 NOTE — ED Provider Notes (Signed)
[facility]    CSN: [id number] Arrival date & time: 01/24/21  1043      History   Chief Complaint Chief Complaint  Patient presents with   Possible Pregnancy    HPI Ariana [name] is a 35 y.o. female.   Patient presenting today with suprapubic abdominal discomfort, cramping, bilateral breast soreness for about a week now.  She denies any vaginal discharge, vaginal bleeding, dysuria, hematuria, flank pain, nausea, vomiting, bowel changes, nipple discharge (though is currently breast-feeding, so no abnormal discharge be on this).  Has arm implant for contraception, states she does not get menstrual cycles with this and has never had [name] episode such as this since being on the implant.  She denies any new sexual partners, history of chronic GU or GI issues.  Not trying anything over-the-counter for symptoms.  Questing pregnancy test.   Past Medical History:  Diagnosis Date   Alpha thalassemia silent carrier    Gestational diabetes     Patient Active Problem List   Diagnosis Date Noted   Normal labor 07/20/2019   Group B streptococcal bacteriuria 02/01/2019   Alpha thalassemia silent carrier 01/22/2019   Supervision of other normal pregnancy, antepartum 01/06/2019   Nausea and vomiting during pregnancy 01/06/2019   History of gestational diabetes in prior pregnancy, currently pregnant 01/06/2019    History reviewed. No pertinent surgical history.  OB History     Gravida  5   Para  5   Term  5   Preterm      AB      Living  5      SAB      IAB      Ectopic      Multiple  0   Live Births  5            Home Medications    Prior to Admission medications   Medication Sig Start Date End Date Taking? Authorizing Provider  acetaminophen (TYLENOL) 325 MG tablet Take 2 tablets (650 mg total) by mouth every 4 (four) hours as needed (for pain scale < 4). 07/22/19   Crisoforo Oxford, Charlesetta Garibaldi, CNM  Elastic Bandages & Supports (COMFORT FIT MATERNITY  SUPP MED) MISC 1 Device by Does not apply route daily. 04/11/19   Leftwich-Kirby, Wilmer Floor, CNM  glycopyrrolate (ROBINUL) 2 MG tablet Take 1 tablet (2 mg total) by mouth 3 (three) times daily as needed. 04/11/19   Leftwich-Kirby, Wilmer Floor, CNM  ibuprofen (ADVIL) 800 MG tablet Take 1 tablet (800 mg total) by mouth 3 (three) times daily. 11/05/19   Palumbo, April, MD  lidocaine (LIDODERM) 5 % Place 1 patch onto the skin daily. Remove & Discard patch within 12 hours or as directed by MD 11/05/19   Nicanor Alcon, April, MD  Misc. Devices MISC Dispense one maternity belt for patient 04/25/19   Conan Bowens, MD  NIFEdipine (ADALAT CC) 30 MG 24 hr tablet Take 1 tablet (30 mg total) by mouth daily. 07/23/19   Marylene Land, CNM  ondansetron (ZOFRAN-ODT) 4 MG disintegrating tablet DISSOLVE 1 TABLET(4 MG) ON THE TONGUE EVERY 6 HOURS AS NEEDED FOR NAUSEA Patient taking differently: Take 4 mg by mouth every 6 (six) hours as needed for nausea or vomiting. DISSOLVE 1 TABLET(4 MG) ON THE TONGUE EVERY 6 HOURS AS NEEDED FOR NAUSEA 07/07/19   Rasch, Victorino Dike I, NP  Prenat-FeAsp-Meth-FA-DHA w/o A (PRENATE PIXIE) 10-0.6-0.4-200 MG CAPS Take 1 capsule by mouth daily. 07/07/19   Rasch, Harolyn Rutherford, NP  Family History Family History  Problem Relation Age of Onset   Healthy Mother     Social History Social History   Tobacco Use   Smoking status: Never   Smokeless tobacco: Never  Vaping Use   Vaping Use: Never used  Substance Use Topics   Alcohol use: No   Drug use: No     Allergies   5-alpha reductase inhibitors   Review of Systems Review of Systems Per HPI  Physical Exam Triage Vital Signs ED Triage Vitals  Enc Vitals Group     BP 01/24/21 1159 117/79     Pulse Rate 01/24/21 1159 93     Resp 01/24/21 1159 17     Temp 01/24/21 1159 98.2 F (36.8 C)     Temp Source 01/24/21 1159 Oral     SpO2 01/24/21 1159 100 %     Weight --      Height --      Head Circumference --      Peak Flow --       Pain Score 01/24/21 1157 0     Pain Loc --      Pain Edu? --      Excl. in GC? --    No data found.  Updated Vital Signs BP 117/79 (BP Location: Right Arm)   Pulse 93   Temp 98.2 F (36.8 C) (Oral)   Resp 17   LMP  (LMP Unknown)   SpO2 100%   Visual Acuity Right Eye Distance:   Left Eye Distance:   Bilateral Distance:    Right Eye Near:   Left Eye Near:    Bilateral Near:     Physical Exam Vitals and nursing note reviewed.  Constitutional:      Appearance: Normal appearance. She is not ill-appearing.  HENT:     Head: Atraumatic.  Eyes:     Extraocular Movements: Extraocular movements intact.     Conjunctiva/sclera: Conjunctivae normal.  Cardiovascular:     Rate and Rhythm: Normal rate and regular rhythm.     Heart sounds: Normal heart sounds.  Pulmonary:     Effort: Pulmonary effort is normal. No respiratory distress.     Breath sounds: Normal breath sounds. No wheezing or rales.  Abdominal:     General: Bowel sounds are normal. There is no distension.     Palpations: Abdomen is soft. There is no mass.     Tenderness: There is abdominal tenderness. There is no right CVA tenderness, left CVA tenderness, guarding or rebound.  Genitourinary:    Comments: GU exam deferred, self swab performed Musculoskeletal:        General: Normal range of motion.     Cervical back: Normal range of motion and neck supple.  Skin:    General: Skin is warm and dry.  Neurological:     Mental Status: She is alert and oriented to person, place, and time.  Psychiatric:        Mood and Affect: Mood normal.        Thought Content: Thought content normal.        Judgment: Judgment normal.   UC Treatments / Results  Labs (all labs ordered are listed, but only abnormal results are displayed) Labs Reviewed  POC URINE PREG, ED  POCT URINALYSIS DIPSTICK, ED / UC  CERVICOVAGINAL ANCILLARY ONLY   EKG  Radiology No results found.  Procedures Procedures (including critical care  time)  Medications Ordered in UC Medications - No data to display  Initial Impression / Assessment and Plan / UC Course  I have reviewed the triage vital signs and the nursing notes.  Pertinent labs & imaging results that were available during my care of the patient were reviewed by me and considered in my medical decision making (see chart for details).     Exam, vitals overall benign and reassuring.  Urine pregnancy negative, UA without evidence of urinary tract infection.  Vaginal swab pending.  Declines lab work today for further evaluation.  Women's health information given for outpatient follow-up.  Discussed continuing to monitor symptoms closely, good supportive home care.  Return for further evaluation if symptoms worsening.  Final Clinical Impressions(s) / UC Diagnoses   Final diagnoses:  Suprapubic abdominal pain  Breast tenderness  Irregular menstrual cycle  Negative pregnancy test   Discharge Instructions   None    ED Prescriptions   None    PDMP not reviewed this encounter.   Particia Nearing, [place] 01/24/21 1529

## 2021-01-24 NOTE — ED Triage Notes (Signed)
Pt presents for pregnancy test. Has not taken at home test. States having abdominal and breast pain xs 1 week.

## 2021-01-27 LAB — CERVICOVAGINAL ANCILLARY ONLY
Bacterial Vaginitis (gardnerella): NEGATIVE
Candida Glabrata: NEGATIVE
Candida Vaginitis: NEGATIVE
Chlamydia: NEGATIVE
Comment: NEGATIVE
Comment: NEGATIVE
Comment: NEGATIVE
Comment: NEGATIVE
Comment: NEGATIVE
Comment: NORMAL
Neisseria Gonorrhea: NEGATIVE
Trichomonas: NEGATIVE

## 2021-04-13 ENCOUNTER — Encounter (HOSPITAL_COMMUNITY): Payer: Self-pay

## 2021-04-13 ENCOUNTER — Ambulatory Visit (HOSPITAL_COMMUNITY)
Admission: EM | Admit: 2021-04-13 | Discharge: 2021-04-13 | Disposition: A | Discharge status: Home / Self Care | Payer: Medicaid Other | Attending: Student | Admitting: Student

## 2021-04-13 ENCOUNTER — Other Ambulatory Visit: Payer: Self-pay

## 2021-04-13 DIAGNOSIS — J111 Influenza due to unidentified influenza virus with other respiratory manifestations: Secondary | ICD-10-CM | POA: Diagnosis not present

## 2021-04-13 LAB — POC INFLUENZA A AND B ANTIGEN (URGENT CARE ONLY)
INFLUENZA A ANTIGEN, POC: NEGATIVE
INFLUENZA B ANTIGEN, POC: NEGATIVE

## 2021-04-13 MED ORDER — PROMETHAZINE-DM 6.25-15 MG/5ML PO SYRP: 5.0000 mL | ORAL_SOLUTION | Freq: Four times a day (QID) | ORAL | 0 refills | Status: DC | PRN

## 2021-04-13 MED ORDER — ONDANSETRON 8 MG PO TBDP: 8.0000 mg | ORAL_TABLET | Freq: Three times a day (TID) | ORAL | 0 refills | Status: DC | PRN

## 2021-04-13 NOTE — Discharge Instructions (Addendum)
-  Take the Zofran (ondansetron) up to 3 times daily for nausea and vomiting. -For fevers/chills, bodyaches, headaches- You can take Tylenol up to 1000 mg 3 times daily, and ibuprofen up to 600 mg 3 times daily with food.  You can take these together, or alternate every 3-4 hours. -Drink plenty of water/gatorade and get plenty of rest -With a virus, you're typically contagious for 5-7 days, or as long as you're having fevers.  -Come back and see us if things are getting worse instead of better, like shortness of breath, chest pain, fevers and chills that are getting higher instead of lower and do not come down with Tylenol or ibuprofen, etc.  

## 2021-04-13 NOTE — ED Triage Notes (Signed)
Pt presents with congestion, cough, weakness, and vomiting X 3 days

## 2021-04-13 NOTE — ED Provider Notes (Signed)
[facility]    CSN: [id number] Arrival date & time: 04/13/21  1607      History   Chief Complaint Chief Complaint  Patient presents with   URI   Emesis    HPI Ariana [name] is a 35 y.o. female presenting with flulike symptoms for 3 days.  Medical history noncontributory.  Here today with son with similar symptoms.  Notes congestion, cough, generalized weakness, subjective chills, nausea with vomiting, generalized crampy abdominal pain.  Has not taken medications for the symptoms.  HPI  Past Medical History:  Diagnosis Date   Alpha thalassemia silent carrier    Gestational diabetes     Patient Active Problem List   Diagnosis Date Noted   Normal labor 07/20/2019   Group B streptococcal bacteriuria 02/01/2019   Alpha thalassemia silent carrier 01/22/2019   Supervision of other normal pregnancy, antepartum 01/06/2019   Nausea and vomiting during pregnancy 01/06/2019   History of gestational diabetes in prior pregnancy, currently pregnant 01/06/2019    History reviewed. No pertinent surgical history.  OB History     Gravida  5   Para  5   Term  5   Preterm      AB      Living  5      SAB      IAB      Ectopic      Multiple  0   Live Births  5            Home Medications    Prior to Admission medications   Medication Sig Start Date End Date Taking? Authorizing Provider  ondansetron (ZOFRAN ODT) 8 MG disintegrating tablet Take 1 tablet (8 mg total) by mouth every 8 (eight) hours as needed for nausea or vomiting. 04/13/21  Yes Rhys Martini, PA-C  promethazine-dextromethorphan (PROMETHAZINE-DM) 6.25-15 MG/5ML syrup Take 5 mLs by mouth 4 (four) times daily as needed for cough. 04/13/21  Yes Rhys Martini, PA-C  acetaminophen (TYLENOL) 325 MG tablet Take 2 tablets (650 mg total) by mouth every 4 (four) hours as needed (for pain scale < 4). 07/22/19   Crisoforo Oxford, Charlesetta Garibaldi, CNM  Elastic Bandages & Supports (COMFORT FIT MATERNITY  SUPP MED) MISC 1 Device by Does not apply route daily. 04/11/19   Leftwich-Kirby, Wilmer Floor, CNM  glycopyrrolate (ROBINUL) 2 MG tablet Take 1 tablet (2 mg total) by mouth 3 (three) times daily as needed. 04/11/19   Leftwich-Kirby, Wilmer Floor, CNM  ibuprofen (ADVIL) 800 MG tablet Take 1 tablet (800 mg total) by mouth 3 (three) times daily. 11/05/19   Palumbo, April, MD  lidocaine (LIDODERM) 5 % Place 1 patch onto the skin daily. Remove & Discard patch within 12 hours or as directed by MD 11/05/19   Nicanor Alcon, April, MD  Misc. Devices MISC Dispense one maternity belt for patient 04/25/19   Conan Bowens, MD  NIFEdipine (ADALAT CC) 30 MG 24 hr tablet Take 1 tablet (30 mg total) by mouth daily. 07/23/19   Marylene Land, CNM  Prenat-FeAsp-Meth-FA-DHA w/o A (PRENATE PIXIE) 10-0.6-0.4-200 MG CAPS Take 1 capsule by mouth daily. 07/07/19   Rasch, Harolyn Rutherford, NP    Family History Family History  Problem Relation Age of Onset   Healthy Mother     Social History Social History   Tobacco Use   Smoking status: Never   Smokeless tobacco: Never  Vaping Use   Vaping Use: Never used  Substance Use Topics   Alcohol use: No  Drug use: No     Allergies   5-alpha reductase inhibitors   Review of Systems Review of Systems  Constitutional:  Positive for appetite change, chills, fatigue and fever.  HENT:  Positive for congestion. Negative for ear pain, rhinorrhea, sinus pressure, sinus pain and sore throat.   Eyes:  Negative for redness and visual disturbance.  Respiratory:  Negative for cough, chest tightness, shortness of breath and wheezing.   Cardiovascular:  Negative for chest pain and palpitations.  Gastrointestinal:  Negative for abdominal pain, constipation, diarrhea, nausea and vomiting.  Genitourinary:  Negative for dysuria, frequency and urgency.  Musculoskeletal:  Positive for myalgias.  Neurological:  Negative for dizziness, weakness and headaches.  Psychiatric/Behavioral:  Negative  for confusion.   All other systems reviewed and are negative.   Physical Exam Triage Vital Signs ED Triage Vitals [04/13/21 1746]  Enc Vitals Group     BP 94/62     Pulse Rate 93     Resp 17     Temp 98.1 F (36.7 C)     Temp Source Oral     SpO2 97 %     Weight      Height      Head Circumference      Peak Flow      Pain Score 7     Pain Loc      Pain Edu?      Excl. in GC?    No data found.  Updated Vital Signs BP 94/62 (BP Location: Left Arm)   Pulse 93   Temp 98.1 F (36.7 C) (Oral)   Resp 17   SpO2 97%   Visual Acuity Right Eye Distance:   Left Eye Distance:   Bilateral Distance:    Right Eye Near:   Left Eye Near:    Bilateral Near:     Physical Exam Vitals reviewed.  Constitutional:      General: She is not in acute distress.    Appearance: Normal appearance. She is ill-appearing.  HENT:     Head: Normocephalic and atraumatic.     Right Ear: Tympanic membrane, ear canal and external ear normal. No tenderness. No middle ear effusion. There is no impacted cerumen. Tympanic membrane is not perforated, erythematous, retracted or bulging.     Left Ear: Tympanic membrane, ear canal and external ear normal. No tenderness.  No middle ear effusion. There is no impacted cerumen. Tympanic membrane is not perforated, erythematous, retracted or bulging.     Nose: Nose normal. No congestion.     Mouth/Throat:     Mouth: Mucous membranes are moist.     Pharynx: Uvula midline. No oropharyngeal exudate or posterior oropharyngeal erythema.  Eyes:     Extraocular Movements: Extraocular movements intact.     Pupils: Pupils are equal, round, and reactive to light.  Cardiovascular:     Rate and Rhythm: Normal rate and regular rhythm.     Heart sounds: Normal heart sounds.  Pulmonary:     Effort: Pulmonary effort is normal.     Breath sounds: Normal breath sounds. No decreased breath sounds, wheezing, rhonchi or rales.  Abdominal:     General: Bowel sounds are  increased.     Palpations: Abdomen is soft.     Tenderness: There is generalized abdominal tenderness. There is no guarding or rebound.     Comments: Generalized tenderness without focal component  Neurological:     General: No focal deficit present.     Mental Status:  She is alert and oriented to person, place, and time.  Psychiatric:        Mood and Affect: Mood normal.        Behavior: Behavior normal.        Thought Content: Thought content normal.        Judgment: Judgment normal.     UC Treatments / Results  Labs (all labs ordered are listed, but only abnormal results are displayed) Labs Reviewed  POC INFLUENZA A AND B ANTIGEN (URGENT CARE ONLY)    EKG   Radiology No results found.  Procedures Procedures (including critical care time)  Medications Ordered in UC Medications - No data to display  Initial Impression / Assessment and Plan / UC Course  I have reviewed the triage vital signs and the nursing notes.  Pertinent labs & imaging results that were available during my care of the patient were reviewed by me and considered in my medical decision making (see chart for details).     This patient is a very pleasant 35 y.o. year old female presenting with suspected influenza. Today this pt is afebrile nontachycardic nontachypneic, oxygenating well on room air, no wheezes rhonchi or rales.   Rapid influenza negative, suspect this was a false negative. Out of tamiflu window Implant contraception  Zofran, promethazine DM.   ED return precautions discussed. Patient verbalizes understanding and agreement.    Final Clinical Impressions(s) / UC Diagnoses   Final diagnoses:  Influenza with respiratory manifestation     Discharge Instructions      -Take the Zofran (ondansetron) up to 3 times daily for nausea and vomiting. -For fevers/chills, bodyaches, headaches- You can take Tylenol up to 1000 mg 3 times daily, and ibuprofen up to 600 mg 3 times daily with  food.  You can take these together, or alternate every 3-4 hours. -Drink plenty of water/gatorade and get plenty of rest -With a virus, you're typically contagious for 5-7 days, or as long as you're having fevers.  -Come back and see [place] if things are getting worse instead of better, like shortness of breath, chest pain, fevers and chills that are getting higher instead of lower and do not come down with Tylenol or ibuprofen, etc.    ED Prescriptions     Medication Sig Dispense Auth. Provider   promethazine-dextromethorphan (PROMETHAZINE-DM) 6.25-15 MG/5ML syrup Take 5 mLs by mouth 4 (four) times daily as needed for cough. 118 mL Ignacia Bayley E, PA-C   ondansetron (ZOFRAN ODT) 8 MG disintegrating tablet Take 1 tablet (8 mg total) by mouth every 8 (eight) hours as needed for nausea or vomiting. 20 tablet Rhys Martini, PA-C      PDMP not reviewed this encounter.   Rhys Martini, PA-C 04/13/21 [phone]

## 2021-12-26 IMAGING — CT CT RENAL STONE PROTOCOL
2 of 4 series · 16 of 46 positions shown, 18 images · non-contrast
Comparison: 08/01/2019

CLINICAL DATA: Pain.

EXAM:
CT ABDOMEN AND PELVIS WITHOUT CONTRAST
TECHNIQUE: Multidetector CT imaging of the abdomen and pelvis was performed
following the standard protocol without IV contrast.

[Series 3: ap without · axial · non-contrast · 0.77mm/px · z∈[-488,-93]mm · 13 of 91 slices shown, 15 images]
[im 6/91  soft-tissue]
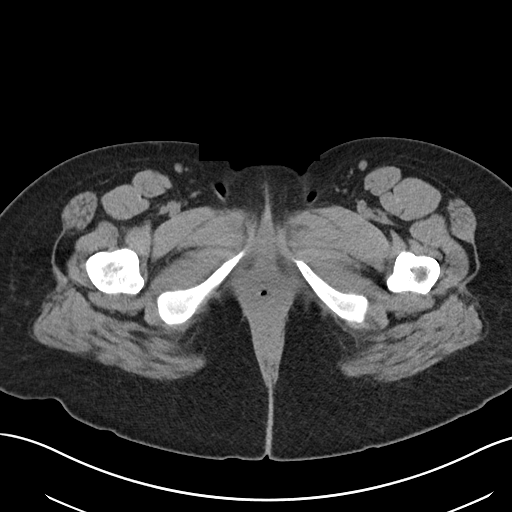
[im 6/91  bone]
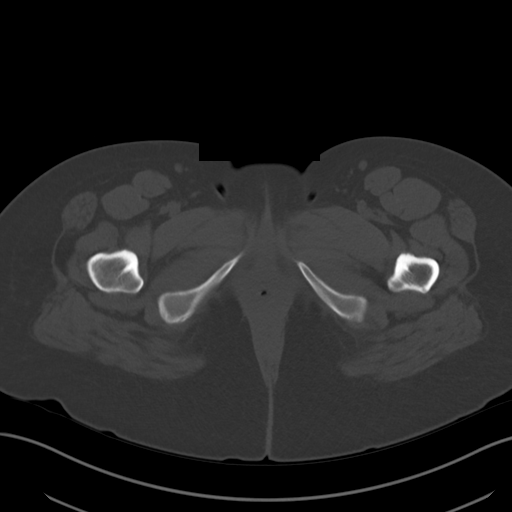
[im 12/91  soft-tissue]
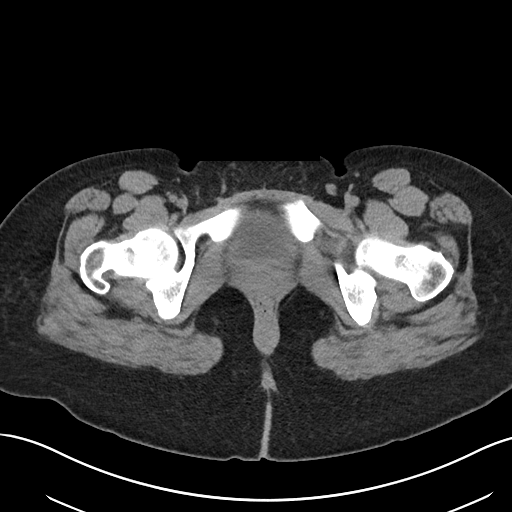
[im 17/91  soft-tissue]
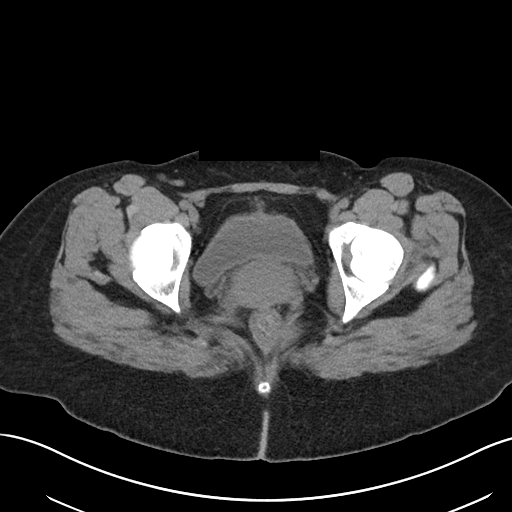
[im 29/91  soft-tissue]
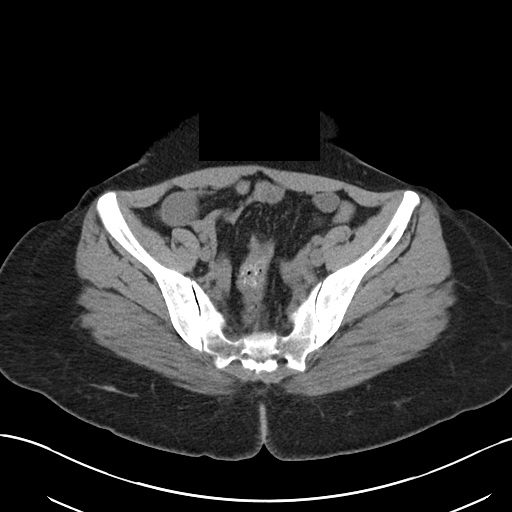
[im 34/91  soft-tissue]
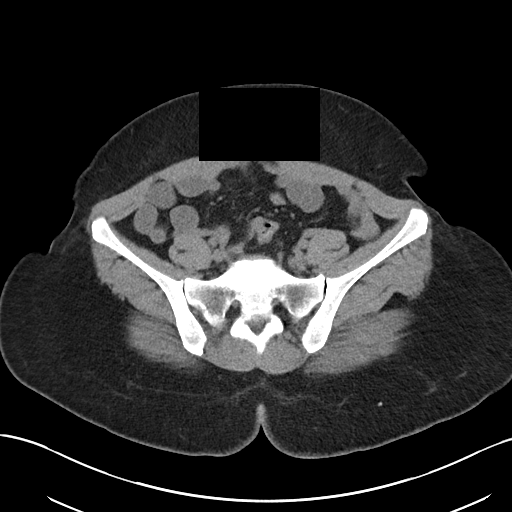
[im 40/91  soft-tissue]
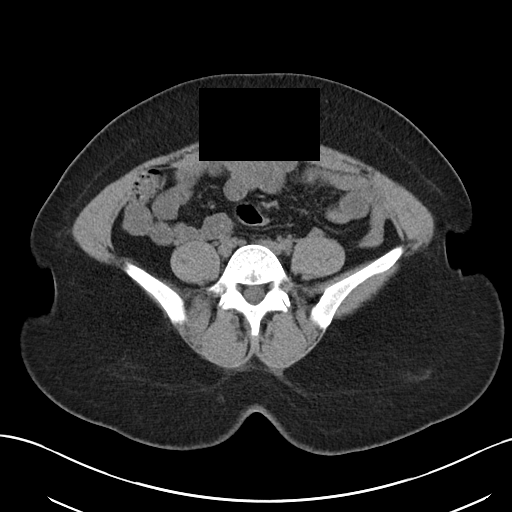
[im 46/91  soft-tissue]
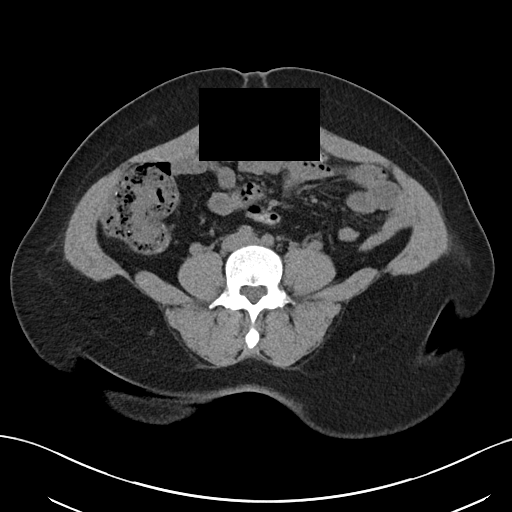
[im 51/91  soft-tissue]
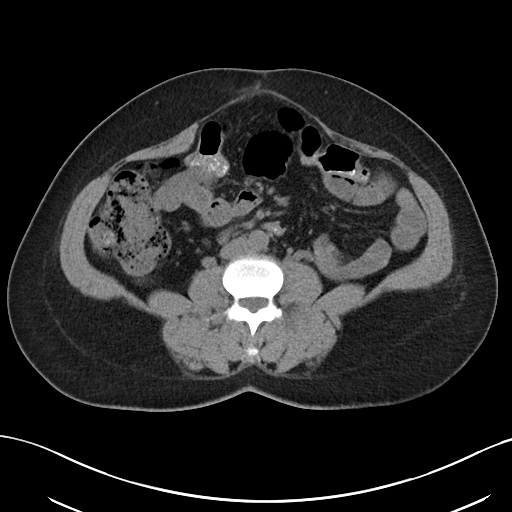
[im 57/91  soft-tissue]
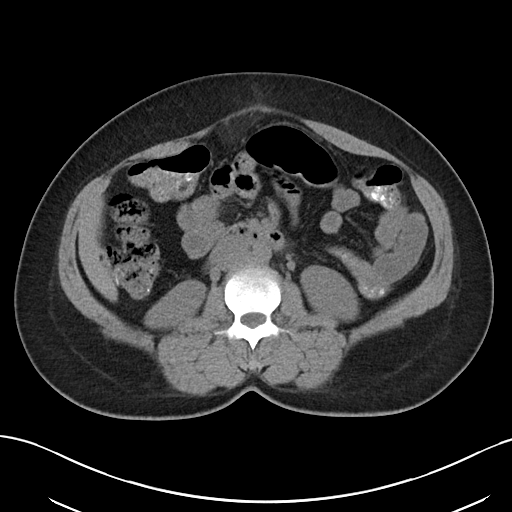
[im 57/91  bone]
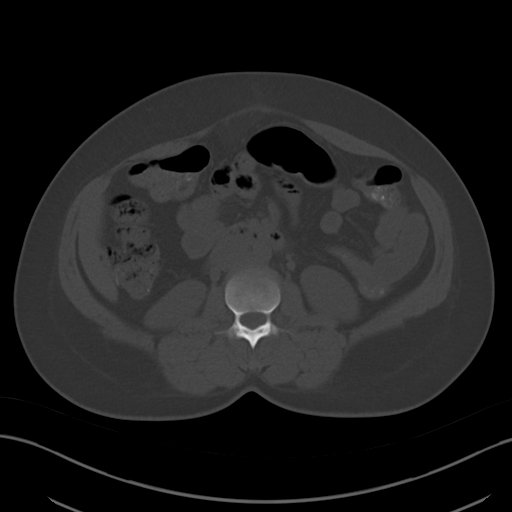
[im 62/91  soft-tissue]
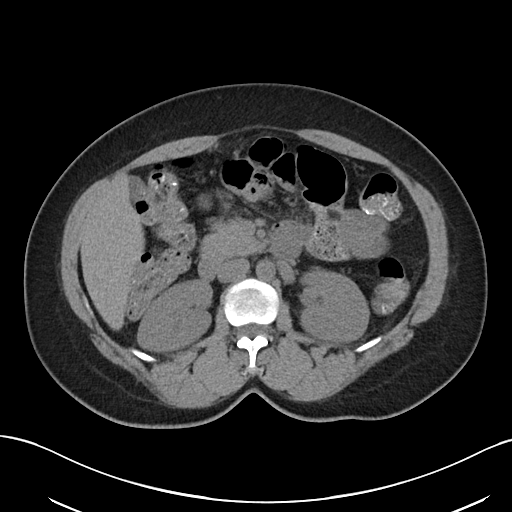
[im 74/91  soft-tissue]
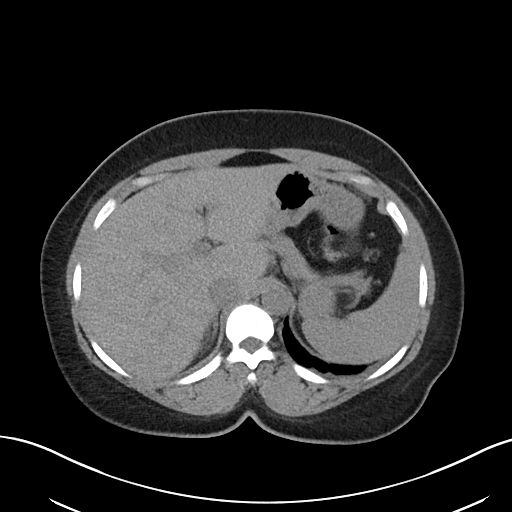
[im 79/91  soft-tissue]
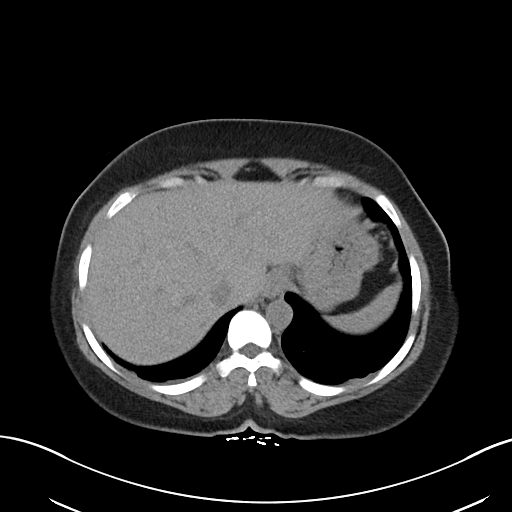
[im 85/91  soft-tissue]
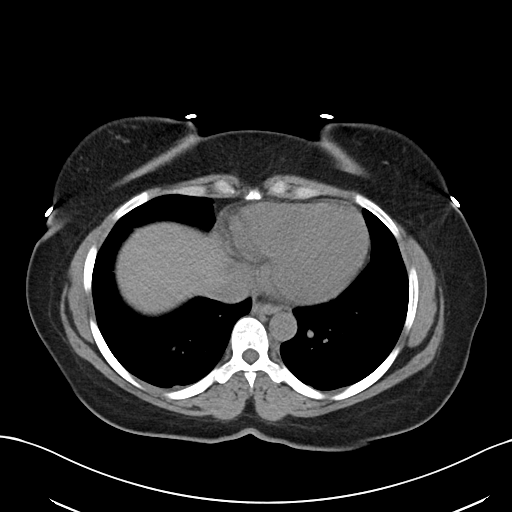

[Series 6: cor · coronal · 0.69mm/px · 3 of 102 slices shown]
[im 34/102  soft-tissue]
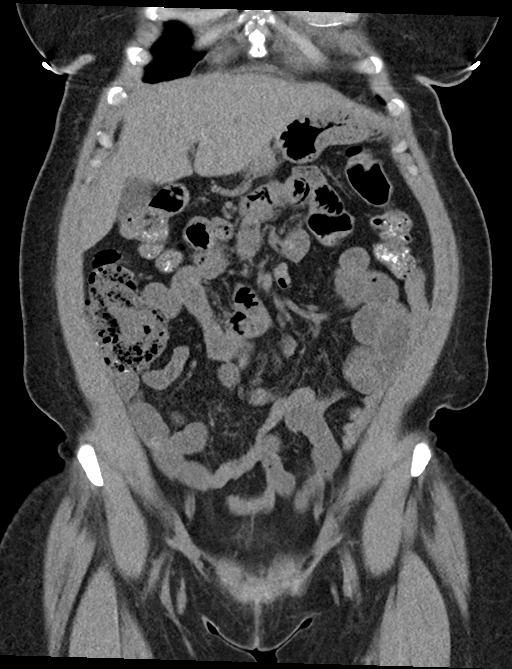
[im 45/102  soft-tissue]
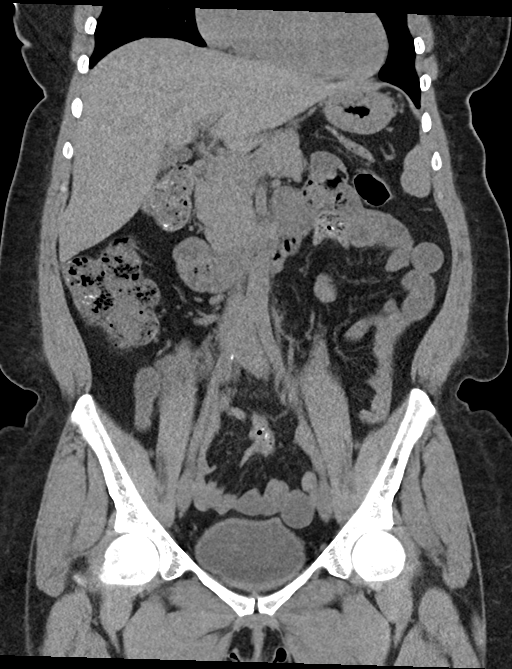
[im 57/102  soft-tissue]
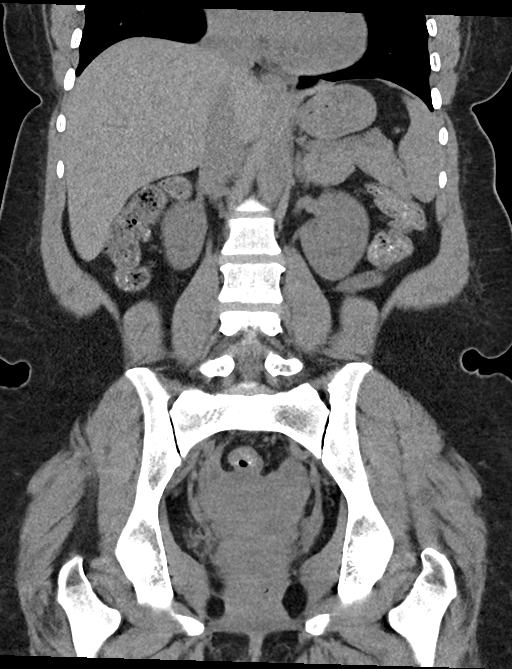

[16 of 46 positions shown; findings below may reference images not displayed]

FINDINGS: Lower chest: There are trace bilateral pleural effusions.The heart
size is normal.

Hepatobiliary: The liver is normal. Normal gallbladder.There is no
biliary ductal dilation.

Pancreas: Normal contours without ductal dilatation. No
peripancreatic fluid collection.

Spleen: Unremarkable.

Adrenals/Urinary Tract:

--Adrenal glands: Unremarkable.

--Right kidney/ureter: No hydronephrosis or radiopaque kidney
stones.

--Left kidney/ureter: No hydronephrosis or radiopaque kidney stones.

--Urinary bladder: Unremarkable.

Stomach/Bowel:

--Stomach/Duodenum: No hiatal hernia or other gastric abnormality.
Normal duodenal course and caliber.

--Small bowel: Unremarkable.

--Colon: Unremarkable.

--Appendix: Normal.

Vascular/Lymphatic: Normal course and caliber of the major abdominal
vessels.

--No retroperitoneal lymphadenopathy.

--No mesenteric lymphadenopathy.

--No pelvic or inguinal lymphadenopathy.

Reproductive: Unremarkable

Other: No ascites or free air. There is a fat containing ventral
wall hernia superior to the umbilicus. There is some fat stranding
involving the herniated fat. More inferiorly, there is a small fat
containing umbilical hernia.

Musculoskeletal. No acute displaced fractures.
IMPRESSION: 1. Fat containing ventral wall hernia superior to the umbilicus with
some fat stranding involving the herniated fat. This could indicate
incarceration in the appropriate clinical setting.
2. Additional small fat containing umbilical hernia.
3. Trace bilateral pleural effusions.

## 2022-01-21 ENCOUNTER — Ambulatory Visit (HOSPITAL_COMMUNITY)
Admission: EM | Admit: 2022-01-21 | Discharge: 2022-01-21 | Disposition: A | Discharge status: Home / Self Care | Payer: Medicaid Other | Attending: Internal Medicine | Admitting: Internal Medicine

## 2022-01-21 ENCOUNTER — Encounter (HOSPITAL_COMMUNITY): Payer: Self-pay

## 2022-01-21 DIAGNOSIS — R109 Unspecified abdominal pain: Secondary | ICD-10-CM | POA: Diagnosis not present

## 2022-01-21 DIAGNOSIS — N939 Abnormal uterine and vaginal bleeding, unspecified: Secondary | ICD-10-CM | POA: Diagnosis not present

## 2022-01-21 LAB — POCT URINALYSIS DIPSTICK, ED / UC
Bilirubin Urine: NEGATIVE
Glucose, UA: NEGATIVE mg/dL
Leukocytes,Ua: NEGATIVE
Nitrite: NEGATIVE
Protein, ur: NEGATIVE mg/dL
Specific Gravity, Urine: >=1.03 (ref 1.005–1.030)
Urobilinogen, UA: 2 mg/dL — ABNORMAL HIGH (ref 0.0–1.0)
pH: 5.5 (ref 5.0–8.0)

## 2022-01-21 LAB — POC URINE PREG, ED: Preg Test, Ur: NEGATIVE

## 2022-01-21 MED ORDER — IBUPROFEN 600 MG PO TABS: 600.0000 mg | ORAL_TABLET | Freq: Four times a day (QID) | ORAL | 0 refills | Status: DC | PRN

## 2022-01-21 MED ORDER — IBUPROFEN 800 MG PO TABS
ORAL_TABLET | ORAL | Status: AC
  Filled 2022-01-21: qty 1

## 2022-01-21 MED ORDER — IBUPROFEN 800 MG PO TABS
800.0000 mg | ORAL_TABLET | Freq: Once | ORAL | Status: AC
  Administered 2022-01-21: 800 mg via ORAL

## 2022-01-21 NOTE — Discharge Instructions (Addendum)
I am concerned that you may have a uterine fibroid that is causing your vaginal bleeding.  I would like for you to schedule an appointment with the OB/GYN listed on your paperwork so that you are able to get a workup and possibly an ultrasound of your uterus to ensure that you do not have a uterine fibroid or any other abnormality to your uterus causing the bleeding.  Your pregnancy test is negative. Your urinalysis does not show any sign of urinary tract infection.  You may take 600 mg of ibuprofen every 6 hours as needed at home with food for abdominal cramping associated with vaginal bleeding.    Your next dose of ibuprofen may be tonight at 9 PM since we gave you some in the clinic today.  If you begin to develop any worsening fatigue, shortness of breath, heart palpitations, or increased vaginal bleeding with large clots, with worsening abdominal pain, please go to the emergency room for further evaluation and possible urgent ultrasound at that time.  Otherwise, you may follow-up with urgent care or go to your PCP.  To find a primary care provider go to [email or web address] and follow the prompts to schedule a new patient appointment for primary care.  Someone from [facility] health will be reaching out to you either via MyChart or phone call to help you set up a primary care provider appointment as well.  It is very important to have a primary care provider to manage your overall health and prevent/manage chronic medical conditions.

## 2022-01-21 NOTE — ED Provider Notes (Signed)
[facility]    CSN: [id number] Arrival date & time: 01/21/22  1035      History   Chief Complaint Chief Complaint  Patient presents with   Vaginal Bleeding    HPI Ariana [name] is a 36 y.o. female.   Patient presents urgent care for evaluation of ongoing vaginal bleeding over the last 3 weeks.  Patient states that she uses Nexplanon birth control implant and has had this for the last 2 years to her right upper arm.  She states that she usually has menstrual cycle regularly once per month and bleeds for approximately 1 week.  She has never had vaginal bleeding last this long while using Nexplanon.  Denies recent new sexual partners/possible exposure to STI.  Denies vaginal itching, odor, and discharge.  No urinary urgency, dysuria, fever/chills, but does report small amount of urinary frequency over the last few weeks.  Patient attributes urinary frequency to increase water intake.  Denies excessive intake of urinary irritants, back pain, nausea, vomiting, diarrhea, dizziness, headache, URI symptoms, and and lightheadedness.  No rash to the vaginal area.  No history of pelvic inflammatory disease.  Currently reporting bilateral lower quadrant abdominal discomfort that she describes as a "weird feeling" and currently rates this discomfort at a 4 on a scale of 0-10.  Last normal bowel movement was this morning and without blood/mucus to the stool.  She has not attempted use of any over-the-counter medications prior to arrival urgent care for her symptoms.  Blood is bright red from the vagina. She states that she is changing her maxi pad twice daily due to the bleeding and denies passing large clots.     Vaginal Bleeding   Past Medical History:  Diagnosis Date   Alpha thalassemia silent carrier    Gestational diabetes     Patient Active Problem List   Diagnosis Date Noted   Normal labor 07/20/2019   Group B streptococcal bacteriuria 02/01/2019   Alpha thalassemia silent  carrier 01/22/2019   Supervision of other normal pregnancy, antepartum 01/06/2019   Nausea and vomiting during pregnancy 01/06/2019   History of gestational diabetes in prior pregnancy, currently pregnant 01/06/2019    History reviewed. No pertinent surgical history.  OB History     Gravida  5   Para  5   Term  5   Preterm      AB      Living  5      SAB      IAB      Ectopic      Multiple  0   Live Births  5            Home Medications    Prior to Admission medications   Medication Sig Start Date End Date Taking? Authorizing Provider  ibuprofen (ADVIL) 600 MG tablet Take 1 tablet (600 mg total) by mouth every 6 (six) hours as needed. 01/21/22  Yes Firas Guardado, Donavan Burnet, FNP    Family History Family History  Problem Relation Age of Onset   Healthy Mother     Social History Social History   Tobacco Use   Smoking status: Never   Smokeless tobacco: Never  Vaping Use   Vaping Use: Never used  Substance Use Topics   Alcohol use: No   Drug use: No     Allergies   5-alpha reductase inhibitors   Review of Systems Review of Systems  Genitourinary:  Positive for vaginal bleeding.  Per HPI   Physical  Exam Triage Vital Signs ED Triage Vitals  Enc Vitals Group     BP 01/21/22 1053 115/74     Pulse Rate 01/21/22 1053 83     Resp 01/21/22 1053 18     Temp 01/21/22 1053 98.3 F (36.8 C)     Temp Source 01/21/22 1053 Oral     SpO2 01/21/22 1053 94 %     Weight --      Height --      Head Circumference --      Peak Flow --      Pain Score 01/21/22 1054 4     Pain Loc --      Pain Edu? --      Excl. in GC? --    No data found.  Updated Vital Signs BP 115/74   Pulse 83   Temp 98.3 F (36.8 C) (Oral)   Resp 18   LMP 12/27/2021   SpO2 94%   Breastfeeding No   Visual Acuity Right Eye Distance:   Left Eye Distance:   Bilateral Distance:    Right Eye Near:   Left Eye Near:    Bilateral Near:     Physical Exam Vitals and  nursing note reviewed.  Constitutional:      Appearance: Normal appearance. She is not ill-appearing or toxic-appearing.     Comments: Very pleasant patient sitting on exam in position of comfort table in no acute distress.   HENT:     Head: Normocephalic and atraumatic.     Right Ear: Hearing and external ear normal.     Left Ear: Hearing and external ear normal.     Nose: Nose normal.     Mouth/Throat:     Lips: Pink.     Mouth: Mucous membranes are moist.  Eyes:     General: Lids are normal. Vision grossly intact. Gaze aligned appropriately.     Extraocular Movements: Extraocular movements intact.     Conjunctiva/sclera: Conjunctivae normal.  Cardiovascular:     Rate and Rhythm: Normal rate and regular rhythm.     Heart sounds: Normal heart sounds, S1 normal and S2 normal.  Pulmonary:     Effort: Pulmonary effort is normal. No respiratory distress.     Breath sounds: Normal breath sounds and air entry.  Abdominal:     Palpations: Abdomen is soft.  Genitourinary:    Comments: Deferred. Musculoskeletal:     Cervical back: Neck supple.  Skin:    General: Skin is warm and dry.     Capillary Refill: Capillary refill takes less than 2 seconds.     Findings: No rash.  Neurological:     General: No focal deficit present.     Mental Status: She is alert and oriented to person, place, and time. Mental status is at baseline.     Cranial Nerves: No dysarthria or facial asymmetry.     Gait: Gait is intact.  Psychiatric:        Mood and Affect: Mood normal.        Speech: Speech normal.        Behavior: Behavior normal.        Thought Content: Thought content normal.        Judgment: Judgment normal.      UC Treatments / Results  Labs (all labs ordered are listed, but only abnormal results are displayed) Labs Reviewed  POCT URINALYSIS DIPSTICK, ED / UC - Abnormal; Notable for the following components:  Result Value   Ketones, ur TRACE (*)    Hgb urine dipstick LARGE (*)     Urobilinogen, UA 2.0 (*)    All other components within normal limits  POC URINE PREG, ED    EKG   Radiology No results found.  Procedures Procedures (including critical care time)  Medications Ordered in UC Medications  ibuprofen (ADVIL) tablet 800 mg (800 mg Oral Given 01/21/22 1219)    Initial Impression / Assessment and Plan / UC Course  I have reviewed the triage vital signs and the nursing notes.  Pertinent labs & imaging results that were available during my care of the patient were reviewed by me and considered in my medical decision making (see chart for details).  1.  Vaginal bleeding Vaginal bleeding is possibly related to a uterine fibroid.  Patient does not have history of uterine leiomyomas.  Urine pregnancy test is negative.  Urinalysis is negative for signs of urinary tract infection.  Mild abdominal cramping along with vaginal bleeding most consistent with possible uterine leiomyoma diagnosis.  Patient given walking referral to OB/GYN for further workup and evaluation of vaginal bleeding.  No clinical indication for blood work today to check blood levels as patient does not appear anemic and denies subjective history of shortness of breath/fatigue.   Strict ED return precautions given.  If patient develops worsening/increased vaginal bleeding, worsening abdominal pain, fever/chills, heart palpitations, or significant fatigue/shortness of breath, she has been instructed to go to the emergency room for further evaluation and management of her symptoms urgently.  Otherwise, she may follow-up with OB/GYN walking referral as stated above PCP assistance initiated today and patient given website for [facility] [email or web address] for self scheduling per PCP.  Patient agreeable with this plan.  She may take ibuprofen 600 mg every 6 hours as needed for abdominal discomfort and cramping.  Patient given 800 mg of ibuprofen in the clinic today for abdominal discomfort.  Next dose of  ibuprofen may be tonight at 9 PM with food.  Advised patient to take this medicine with food to avoid GI upset.   Discussed physical exam and available lab work findings in clinic with patient.  Counseled patient regarding appropriate use of medications and potential side effects for all medications recommended or prescribed today. Discussed red flag signs and symptoms of worsening condition,when to call the PCP office, return to urgent care, and when to seek higher level of care in the emergency department. Patient verbalizes understanding and agreement with plan. All questions answered. Patient discharged in stable condition.   Final Clinical Impressions(s) / UC Diagnoses   Final diagnoses:  Vaginal bleeding  Abdominal discomfort     Discharge Instructions      I am concerned that you may have a uterine fibroid that is causing your vaginal bleeding.  I would like for you to schedule an appointment with the OB/GYN listed on your paperwork so that you are able to get a workup and possibly an ultrasound of your uterus to ensure that you do not have a uterine fibroid or any other abnormality to your uterus causing the bleeding.  Your pregnancy test is negative. Your urinalysis does not show any sign of urinary tract infection.  You may take 600 mg of ibuprofen every 6 hours as needed at home with food for abdominal cramping associated with vaginal bleeding.    Your next dose of ibuprofen may be tonight at 9 PM since we gave you some in the clinic today.  If  you begin to develop any worsening fatigue, shortness of breath, heart palpitations, or increased vaginal bleeding with large clots, with worsening abdominal pain, please go to the emergency room for further evaluation and possible urgent ultrasound at that time.  Otherwise, you may follow-up with urgent care or go to your PCP.  To find a primary care provider go to [email or web address] and follow the prompts to schedule a  new patient appointment for primary care.  Someone from [facility] health will be reaching out to you either via MyChart or phone call to help you set up a primary care provider appointment as well.  It is very important to have a primary care provider to manage your overall health and prevent/manage chronic medical conditions.     ED Prescriptions     Medication Sig Dispense Auth. Provider   ibuprofen (ADVIL) 600 MG tablet Take 1 tablet (600 mg total) by mouth every 6 (six) hours as needed. 30 tablet Talbot Grumbling, FNP      PDMP not reviewed this encounter.   Talbot Grumbling, [place] 01/21/22 1236

## 2022-01-21 NOTE — ED Triage Notes (Signed)
Pt states she has been on her menstrual cycle for 3 weeks, going through 2 pads a day.

## 2022-07-30 ENCOUNTER — Ambulatory Visit: Payer: Medicaid Other | Admitting: Student

## 2022-08-06 ENCOUNTER — Encounter: Payer: Self-pay | Admitting: Family Medicine

## 2022-08-06 ENCOUNTER — Ambulatory Visit: Payer: Medicaid Other | Admitting: Family Medicine

## 2022-08-06 VITALS — BP 134/82 | HR 74 | Ht 63.0 in | Wt 167.6 lb

## 2022-08-06 DIAGNOSIS — Z7689 Persons encountering health services in other specified circumstances: Secondary | ICD-10-CM | POA: Diagnosis not present

## 2022-08-06 DIAGNOSIS — Z23 Encounter for immunization: Secondary | ICD-10-CM

## 2022-08-06 DIAGNOSIS — Z975 Presence of (intrauterine) contraceptive device: Secondary | ICD-10-CM

## 2022-08-06 DIAGNOSIS — Z1159 Encounter for screening for other viral diseases: Secondary | ICD-10-CM | POA: Diagnosis not present

## 2022-08-06 NOTE — Assessment & Plan Note (Signed)
Nexplanon in place and ready for removal. Discussed with patient nature of new patient visit and time constraints for nexplanon removal and re-insertion. She is amenable to coming back for a longer appointment slot for this to be completed. Discussed some data for 5 year duration of nexplanon though continued to recommend condoms/barrier methods of contraception until new device was implanted.

## 2022-08-06 NOTE — Patient Instructions (Addendum)
It was great to see you today! Here's what we talked about:  You will need to schedule an appointment with a female physician for your pap smear. They can likely do your nexplanon removal at this appointment as well. You received your COVID/flu vaccines today. We have tested you for hepatitis C today, as well. I will follow up those results.  Please let me know if you have any other questions.  Dr. Marcha Dutton  Dental list         Updated 11.20.18 These dentists all accept Medicaid.  The list is a courtesy and for your convenience. Estos dentistas aceptan Medicaid.  La lista es para su [place] y es una cortesa.     [facility]     [phone] [place] [address] Se habla espaol From 35 to 57 years old Parent may go with child only for cleaning Anette Riedel DDS     [place], [place] ([place] speaking) [address] [place]  [address] Se habla espaol From 58 to 90 years old Parent may go with child   Rolene Arbour DMD    [id number] [place] [address] Se habla espaol Vietnamese spoken From 96 years old Parent may go with child [facility]     [phone] [place]. [place] [address] Se habla espaol From 7 to 3 years old Parent may NOT go with child  Marcelo Baldy DDS  [phone] [facility]      [address]  [place] [address] Center spoken (preferred to bring translator) From teeth coming in to 52 years old Parent may go with child  [facility]     [phone] [address] [place]. [place] [id number] Requires certification. Call for information. Requiere certificacin. Llame para informacin. Algunos dias se habla espaol  From birth to 82 years Parent possibly goes with child   Kandice Hams DDS     [place].  [address] [place] [address] Se habla  espaol From 18 months to 18 years  Parent may go with child  [facility] DDS     Merry Proud DDS  [phone] [address] [place] [address] Se habla espaol From 40 year old Parent may go with child   Shelton Silvas DDS    [phone] [address] [place] [address] Se habla espaol  From 65 months to 67 years old Parent may go with child Ivory Broad DDS    [phone] [address] [place] [address] Se habla espaol From 75 to 41 years old Parent may go with child  [facility]    [phone] [address] [place] [address] No se Joneen Caraway From birth [facility]  [phone] [address] [place] [address] Se habla espanol Interpretation for other languages Special needs children welcome  Moss Mc, DDS PA     [phone] [place].  [place], [place] [address] From 38 years old   Special needs children welcome  [facility]   [phone] Dr. Janeice Robinson [address] [place], [place] [address] Se habla espaol From birth to 44 years Special needs children welcome   [facility] [place] [phone] [address] [place], [place] [address]   [place] [phone] [place], [place] [address]

## 2022-08-06 NOTE — Progress Notes (Addendum)
    SUBJECTIVE:   CHIEF COMPLAINT / HPI:   New patient PMH: GDM in fourth pregnancy but resolved. Has been seen many times in ED for abdominal pain and her stomach no longer bothers her. PSH: None Meds: Nexplanon inserted for 3 years Allergies: 5-alpha reductase inhibitors but unsure of rxn (happened while pregnant) FH: None SH: 5 children (20,15,7,4,3; 2 boys and 3 girls), husband, feels safe in relationships  Nexplanon removal and reinsertion Had nexplanon since her last delivery 3 years ago. She is scheduled for removal with her CNM on 3/18 though would like this removed sooner. She would like this removed today with reinsertion of another.  OBJECTIVE:   BP 134/82   Pulse 74   Ht 5' 3"$  (1.6 m)   Wt 167 lb 9.6 oz (76 kg)   SpO2 98%   BMI 29.69 kg/m   General: Alert and oriented, in NAD Skin: Warm, dry, and intact HEENT: NCAT, EOM grossly normal, midline nasal septum Cardiac: RRR, no m/r/g appreciated Respiratory: CTAB, breathing and speaking comfortably on RA Abdominal: Soft, nontender, nondistended, normoactive bowel sounds Extremities: Moves all extremities grossly equally Neurological: No gross focal deficit Psychiatric: Appropriate mood and affect, PHQ-9 = 0  ASSESSMENT/PLAN:   Nexplanon in place Nexplanon in place and ready for removal. Discussed with patient nature of new patient visit and time constraints for nexplanon removal and re-insertion. She is amenable to coming back for a longer appointment slot for this to be completed. Discussed some data for 5 year duration of nexplanon though continued to recommend condoms/barrier methods of contraception until new device was implanted.   Health maintenance Hepatitis C screening obtained at this visit. Patient obtained COVID and flu vaccines. Patient would prefer pap smear to be completed by female physician; recommended making an appointment with female physician for completion and nexplanon  removal/re-insertion.  Ethelene Hal, MD [place]

## 2022-08-07 ENCOUNTER — Encounter: Payer: Self-pay | Admitting: Family Medicine

## 2022-08-07 LAB — HCV INTERPRETATION

## 2022-08-07 LAB — HCV AB W REFLEX TO QUANT PCR: HCV Ab: NONREACTIVE

## 2022-08-11 ENCOUNTER — Encounter: Payer: Self-pay | Admitting: Family Medicine

## 2022-08-11 ENCOUNTER — Ambulatory Visit (INDEPENDENT_AMBULATORY_CARE_PROVIDER_SITE_OTHER): Payer: Medicaid Other | Admitting: Family Medicine

## 2022-08-11 VITALS — BP 116/87 | HR 71 | Ht 63.0 in | Wt 167.6 lb

## 2022-08-11 DIAGNOSIS — Z975 Presence of (intrauterine) contraceptive device: Secondary | ICD-10-CM

## 2022-08-11 DIAGNOSIS — Z3046 Encounter for surveillance of implantable subdermal contraceptive: Secondary | ICD-10-CM

## 2022-08-11 NOTE — Patient Instructions (Addendum)
It was great seeing you today!  Today we removed your nexplanon and reinserted a new one, this will expire in 3 years. Please leave the gauze and steristripes on for at least 24 hours and keep the area dry. If you notice bleeding, drainage, pus or experience a fever then please schedule an appointment to be seen at your earliest convenience.   Please make sure to schedule a PAP smear at your convenience.   Please follow up at your next scheduled appointment, if anything arises between now and then, please don't hesitate to contact our office.   Thank you for allowing [place] to be a part of your medical care!  Thank you, Dr. Larae Grooms  Also a reminder of our clinic's no-show policy. Please make sure to arrive at least 15 minutes prior to your scheduled appointment time. Please try to cancel before 24 hours if you are not able to make it. If you no-show for 2 appointments then you will be receiving a warning letter. If you no-show after 3 visits, then you may be at risk of being dismissed from our clinic. This is to ensure that everyone is able to be seen in a timely manner. Thank you, we appreciate your assistance with this!

## 2022-08-11 NOTE — Progress Notes (Signed)
    SUBJECTIVE:   CHIEF COMPLAINT / HPI:   Patient presents with desire to have nexplanon replaced and reinserted. She is satisfied with the nexplanon and would like to continue this knowing that she does not have to worry about replacing it for another 3 years. Denies any concerns regarding this at his time. She politely declines PAP smear today since she is menstruating but prefers to reschedule with me at a later time.   OBJECTIVE:   BP 116/87   Pulse 71   Ht 5' 3"$  (1.6 m)   Wt 167 lb 9.6 oz (76 kg)   LMP 08/10/2022   SpO2 100%   BMI 29.69 kg/m   General: Patient well-appearing, in no acute distress. Resp: normal work of breathing noted Ext: nexplanon in place along left UE with palpation   PROCEDURE note: Nexplanon removal and reinsertion  Nexplanon palpated within left upper extremity. Discussed the risks and benefits of nexplanon removal and reinsertion with patient. Patient given informed consent, signed copy in the chart.  Appropriate time out taken Marked area of interest and then cleaned with betadine and alcohol swabs. The patient's  left arm was prepped and draped in the usual sterile fashion.. The ruler used to measure and mark the insertion area 8 cm from medial epicondyle of the elbow. Local anaesthesia obtained using 1.5 cc of 1% lidocaine with epinephrine. Nexplanon was inserted per manufacturer's directions. Less than 1 cc blood loss. The insertion site covered with antibiotic ointment and a pressure bandage to minimize bruising. There were no complications and the patient tolerated the procedure well.  Device information was given in handout form. Patient is informed the removal date will be in three years and package insert card filled out and given to her.   ASSESSMENT/PLAN:   Nexplanon in place -removed and reinserted 2/27 -completed card given to patient and discussed that she will need replacement in 3 years -completed nexplanon card provided  -care instructions  discussed and strict return precautions provided  -follow up scheduled for PAP smear with me, per patient preference    Donney Dice, [place]

## 2022-08-11 NOTE — Assessment & Plan Note (Addendum)
-  removed and reinserted 2/27 -completed card given to patient and discussed that she will need replacement in 3 years -completed nexplanon card provided  -care instructions discussed and strict return precautions provided  -follow up scheduled for PAP smear with me, per patient preference

## 2022-08-24 MED ORDER — ETONOGESTREL 68 MG ~~LOC~~ IMPL
68.0000 mg | DRUG_IMPLANT | Freq: Once | SUBCUTANEOUS | Status: AC
Start: 2022-08-24 — End: 2022-08-11
  Administered 2022-08-11: 68 mg via SUBCUTANEOUS

## 2022-08-24 NOTE — Addendum Note (Signed)
Addended by: Talbot Grumbling on: 08/24/2022 12:37 PM   Modules accepted: Orders

## 2022-08-31 ENCOUNTER — Ambulatory Visit: Payer: Medicaid Other | Admitting: Advanced Practice Midwife

## 2022-09-01 ENCOUNTER — Ambulatory Visit: Payer: Self-pay | Admitting: Family Medicine

## 2023-02-18 ENCOUNTER — Ambulatory Visit: Payer: Medicaid Other | Admitting: Student

## 2023-02-25 ENCOUNTER — Ambulatory Visit (INDEPENDENT_AMBULATORY_CARE_PROVIDER_SITE_OTHER): Payer: Medicaid Other | Admitting: Family Medicine

## 2023-02-25 ENCOUNTER — Encounter: Payer: Self-pay | Admitting: Family Medicine

## 2023-02-25 ENCOUNTER — Other Ambulatory Visit (HOSPITAL_COMMUNITY)
Admission: RE | Admit: 2023-02-25 | Discharge: 2023-02-25 | Disposition: A | Discharge status: Home / Self Care | Payer: Medicaid Other | Source: Ambulatory Visit | Attending: Family Medicine | Admitting: Family Medicine

## 2023-02-25 VITALS — BP 114/89 | HR 85 | Ht 63.0 in | Wt 169.2 lb

## 2023-02-25 DIAGNOSIS — Z23 Encounter for immunization: Secondary | ICD-10-CM | POA: Diagnosis not present

## 2023-02-25 DIAGNOSIS — N8110 Cystocele, unspecified: Secondary | ICD-10-CM | POA: Diagnosis not present

## 2023-02-25 DIAGNOSIS — Z124 Encounter for screening for malignant neoplasm of cervix: Secondary | ICD-10-CM | POA: Insufficient documentation

## 2023-02-25 NOTE — Assessment & Plan Note (Addendum)
Since her last vaginal delivery 3 years ago.  No urinary symptoms.  Will refer to urogynecology

## 2023-02-25 NOTE — Progress Notes (Signed)
    SUBJECTIVE:   CHIEF COMPLAINT / HPI:   Pap smear Patient presenting for her Pap smear.  She would also like STD testing.  Patient notes that she has had vaginal prolapse since her last baby 3 years ago.  States that she has had 4 babies vaginally.  No urine or fecal incontinence.  She states that she got a Nexplanon placed a couple months ago at our clinic, has had increased and irregular bleeding since then.  PERTINENT  PMH / PSH: alpha thalassemia silent carrier  OBJECTIVE:   BP 114/89   Pulse 85   Ht 5\' 3"  (1.6 m)   Wt 169 lb 3.2 oz (76.7 kg)   SpO2 100%   BMI 29.97 kg/m   GEN: Well-developed, well-nourished, no acute distress Pelvic: Dr. Manson Passey chaperoning.  Some prolapse of vaginal wall.  Cervical os closed.  No bleeding or discharge.  No lesions noted to vaginal wall.  Bimanual exam with some general tenderness. No adnexal tenderness.  ASSESSMENT/PLAN:   Encounter for Papanicolaou smear of cervix Pap Smear completed, GC chlamydia, trichomoniasis, HPV added.  Vaginal prolapse without uterine prolapse Since her last vaginal delivery 3 years ago.  No urinary symptoms.  Will refer to urogynecology     Para March, DO [facility]

## 2023-02-25 NOTE — Assessment & Plan Note (Addendum)
Pap Smear completed, GC chlamydia, trichomoniasis, HPV added.

## 2023-02-25 NOTE — Patient Instructions (Signed)
It was wonderful to see you today.  Please bring ALL of your medications with you to every visit.   Today we completed your pap smear. If your results are normal, I will send you a MyChart message or letter. Otherwise, I will give you a call.  I have referred you to urogynecology for your prolapse, they will call you to set up an appointment. Please give [place] a call if you don't hear back in the next 2 weeks  Thank you for choosing [facility].     Best, Dr. [facility]

## 2023-03-02 ENCOUNTER — Encounter: Payer: Self-pay | Admitting: Family Medicine

## 2023-03-02 LAB — CYTOLOGY - PAP
Chlamydia: NEGATIVE
Comment: NEGATIVE
Comment: NEGATIVE
Comment: NEGATIVE
Comment: NORMAL
Diagnosis: NEGATIVE
High risk HPV: NEGATIVE
Neisseria Gonorrhea: NEGATIVE
Trichomonas: NEGATIVE

## 2023-04-14 DIAGNOSIS — Z131 Encounter for screening for diabetes mellitus: Secondary | ICD-10-CM | POA: Diagnosis not present

## 2023-04-14 DIAGNOSIS — Z7189 Other specified counseling: Secondary | ICD-10-CM | POA: Diagnosis not present

## 2023-04-14 DIAGNOSIS — E669 Obesity, unspecified: Secondary | ICD-10-CM | POA: Diagnosis not present

## 2023-04-14 DIAGNOSIS — E559 Vitamin D deficiency, unspecified: Secondary | ICD-10-CM | POA: Diagnosis not present

## 2023-04-14 DIAGNOSIS — Z0000 Encounter for general adult medical examination without abnormal findings: Secondary | ICD-10-CM | POA: Diagnosis not present

## 2023-04-14 DIAGNOSIS — Z13220 Encounter for screening for lipoid disorders: Secondary | ICD-10-CM | POA: Diagnosis not present

## 2023-04-14 DIAGNOSIS — Z6829 Body mass index (BMI) 29.0-29.9, adult: Secondary | ICD-10-CM | POA: Diagnosis not present

## 2023-10-06 NOTE — Progress Notes (Unsigned)
 New Patient Evaluation and Consultation  Referring Provider: Marveen Slick, * PCP: Dema Filler, MD Date of Service: 10/08/2023  SUBJECTIVE Chief Complaint: No chief complaint on file.  History of Present Illness: Ariana [name] is a 38 y.o. Black or African-American female seen in consultation at the request of Dr Lanetta Pion for evaluation of pelvic organ prolapse.    Vaginal prolapse since delivery in 2021. Denies urinary or fecal incontinence Irregular bleeding with Nexplanon  in place ***Review of records significant for: ***alpha thalassemia carrier, GDM, abdominal wall hernia  Urinary Symptoms: {urine leakage?:24754} Leaks *** time(s) per {days/wks/mos/yrs:310907}.  Pad use: {NUMBERS 1-10:18281} {pad option:24752} per day.   Patient {ACTION; IS/IS NGE:95284132} bothered by UI symptoms.  Day time voids ***.  Nocturia: *** times per night to void. Voiding dysfunction:  {empties:24755} bladder well.  Patient {DOES NOT does:27190::"does not"} use a catheter to empty bladder.  When urinating, patient feels {urine symptoms:24756} Drinks: *** per day  UTIs: {NUMBERS 1-10:18281} UTI's in the last year.   {ACTIONS;DENIES/REPORTS:21021675::"Denies"} history of {urologic concerns:24757} No results found for the last 90 days.   Pelvic Organ Prolapse Symptoms:                  Patient {denies/ admits to:24761} a feeling of a bulge the vaginal area. It has been present for {NUMBER 1-10:22536} {days/wks/mos/yrs:310907}.  Patient {denies/ admits to:24761} seeing a bulge.  This bulge {ACTION; IS/IS GMW:10272536} bothersome.  Bowel Symptom: Bowel movements: *** time(s) per {Time; day/week/month:13537} Stool consistency: {stool consistency:24758} Straining: {yes/no:19897}.  Splinting: {yes/no:19897}.  Incomplete evacuation: {yes/no:19897}.  Patient {denies/ admits to:24761} accidental bowel leakage / fecal incontinence  Occurs: *** time(s) per {Time;  day/week/month:13537}  Consistency with leakage: {stool consistency:24758} Bowel regimen: {bowel regimen:24759} Last colonoscopy: Date ***, Results *** HM Colonoscopy   This patient has no relevant Health Maintenance data.     Sexual Function Sexually active: {yes/no:19897}.  Sexual orientation: {Sexual Orientation:[phone]} Pain with sex: {pain with sex:24762}  Pelvic Pain {denies/ admits to:24761} pelvic pain Location: *** Pain occurs: *** Prior pain treatment: *** Improved by: *** Worsened by: ***   Past Medical History:  Past Medical History:  Diagnosis Date   Alpha thalassemia silent carrier    Gestational diabetes      Past Surgical History:  No past surgical history on file.   Past OB/GYN History: OB History  Gravida Para Term Preterm AB Living  5 5 5   5   SAB IAB Ectopic Multiple Live Births     0 5    # Outcome Date GA Lbr Len/2nd Weight Sex Type Anes PTL Lv  5 Term 07/21/19 [redacted]w[redacted]d 02:19 6 lb 12.3 oz (3.07 kg) F Vag-Spont None  LIV  4 Term 10/13/17 [redacted]w[redacted]d 04:08 7 lb 6.3 oz (3.355 kg) F Vag-Spont None  LIV  3 Term      Vag-Spont     2 Term      Vag-Spont     1 Term      Vag-Spont       Vaginal deliveries: ***,  Forceps/ Vacuum deliveries: ***, Cesarean section: *** Menopausal: {menopausal:24763} Contraception: Nexplanon . Last pap smear was ***.  Any history of abnormal pap smears: {yes/no:19897}.    Component Value Date/Time   DIAGPAP  02/25/2023 1630    - Negative for intraepithelial lesion or malignancy (NILM)   DIAGPAP  04/07/2017 0000    NEGATIVE FOR INTRAEPITHELIAL LESIONS OR MALIGNANCY.   HPVHIGH Negative 02/25/2023 1630   ADEQPAP  02/25/2023 1630    Satisfactory for  evaluation; transformation zone component PRESENT.   ADEQPAP  04/07/2017 0000    Satisfactory for evaluation  endocervical/transformation zone component PRESENT.    Medications: Patient currently has no medications in their medication list.   Allergies: Patient is allergic  to 5-alpha reductase inhibitors.   Social History:  Social History   Tobacco Use   Smoking status: Never   Smokeless tobacco: Never  Vaping Use   Vaping status: Never Used  Substance Use Topics   Alcohol use: No   Drug use: No    Relationship status: {relationship status:24764} Patient lives with ***.   Patient {ACTION; IS/IS RUE:45409811} employed ***. Regular exercise: {Yes/No:304960894} History of abuse: {Yes/No:304960894}  Family History:   Family History  Problem Relation Age of Onset   Healthy Mother      Review of Systems: ROS   OBJECTIVE Physical Exam: There were no vitals filed for this visit.  Physical Exam   GU / Detailed Urogynecologic Evaluation:  Pelvic Exam: Normal external female genitalia; Bartholin's and Skene's glands normal in appearance; urethral meatus normal in appearance, no urethral masses or discharge.   CST: {gen negative/positive:315881}  Reflexes: bulbocavernosis {DESC; PRESENT/NOT PRESENT:21021351}, anocutaneous {DESC; PRESENT/NOT PRESENT:21021351} ***bilaterally.  Speculum exam reveals normal vaginal mucosa {With/Without:20273} atrophy. Cervix {exam; gyn cervix:30847}. Uterus {exam; pelvic uterus:30849}. Adnexa {exam; adnexa:12223}.    s/p hysterectomy: Speculum exam reveals normal vaginal mucosa {With/Without:20273}  atrophy and normal vaginal cuff.  Adnexa {exam; adnexa:12223}.    With apex supported, anterior compartment defect was {reduced:24765}  Pelvic floor strength {Roman # I-V:19040}/V, puborectalis {Roman # I-V:19040}/V external anal sphincter {Roman # I-V:19040}/V  Pelvic floor musculature: Right levator {Tender/Non-tender:20250}, Right obturator {Tender/Non-tender:20250}, Left levator {Tender/Non-tender:20250}, Left obturator {Tender/Non-tender:20250}  POP-Q:   POP-Q                                               Aa                                               Ba                                                  C                                                Gh                                               Pb                                               tvl  Ap                                               Bp                                                 D      Rectal Exam:  Normal sphincter tone, {rectocele:24766} distal rectocele, enterocoele {DESC; PRESENT/NOT PRESENT:21021351}, no rectal masses, {sign of:24767} dyssynergia when asking the patient to bear down.  Post-Void Residual (PVR) by Bladder Scan: In order to evaluate bladder emptying, we discussed obtaining a postvoid residual and patient agreed to this procedure.  Procedure: The ultrasound unit was placed on the patient's abdomen in the suprapubic region after the patient had voided.      Laboratory Results: Lab Results  Component Value Date   COLORU yellow 05/05/2017   CLARITYU clear 05/05/2017   GLUCOSEUR neg 05/05/2017   BILIRUBINUR NEGATIVE 01/21/2022   KETONESU neg 05/05/2017   SPECGRAV 1.010 05/05/2017   RBCUR neg 05/05/2017   PHUR 6.0 05/05/2017   PROTEINUR NEGATIVE 01/21/2022   UROBILINOGEN 2.0 (H) 01/21/2022   LEUKOCYTESUR NEGATIVE 01/21/2022    Lab Results  Component Value Date   CREATININE 0.97 11/04/2019   CREATININE 0.58 08/01/2019   CREATININE 0.77 07/22/2019    Lab Results  Component Value Date   HGBA1C 4.7 (L) 01/06/2019    Lab Results  Component Value Date   HGB 11.1 (L) 11/04/2019     ASSESSMENT AND PLAN Ms. Ferrante is a 38 y.o. with: No diagnosis found.  There are no diagnoses linked to this encounter.   Darlene Ehlers, MD

## 2023-10-08 ENCOUNTER — Other Ambulatory Visit (HOSPITAL_COMMUNITY)
Admission: RE | Admit: 2023-10-08 | Discharge: 2023-10-08 | Disposition: A | Discharge status: Home / Self Care | Source: Ambulatory Visit | Attending: Obstetrics | Admitting: Obstetrics

## 2023-10-08 ENCOUNTER — Other Ambulatory Visit: Payer: Self-pay

## 2023-10-08 ENCOUNTER — Encounter: Payer: Self-pay | Admitting: Obstetrics

## 2023-10-08 ENCOUNTER — Ambulatory Visit: Payer: Medicaid Other | Admitting: Obstetrics

## 2023-10-08 VITALS — BP 118/83 | HR 80 | Ht 62.6 in | Wt 174.6 lb

## 2023-10-08 DIAGNOSIS — K5900 Constipation, unspecified: Secondary | ICD-10-CM | POA: Diagnosis not present

## 2023-10-08 DIAGNOSIS — N941 Unspecified dyspareunia: Secondary | ICD-10-CM | POA: Insufficient documentation

## 2023-10-08 DIAGNOSIS — Z8632 Personal history of gestational diabetes: Secondary | ICD-10-CM

## 2023-10-08 DIAGNOSIS — N819 Female genital prolapse, unspecified: Secondary | ICD-10-CM | POA: Diagnosis not present

## 2023-10-08 DIAGNOSIS — N8110 Cystocele, unspecified: Secondary | ICD-10-CM

## 2023-10-08 LAB — POCT URINALYSIS DIPSTICK
Bilirubin, UA: NEGATIVE
Blood, UA: NEGATIVE
Glucose, UA: NEGATIVE
Leukocytes, UA: NEGATIVE
Nitrite, UA: NEGATIVE
Protein, UA: NEGATIVE
Spec Grav, UA: >=1.03 — AB (ref 1.010–1.025)
Urobilinogen, UA: 0.2 U/dL
pH, UA: 5.5 (ref 5.0–8.0)

## 2023-10-08 NOTE — Assessment & Plan Note (Addendum)
-   encouraged weight reduction due to association with pelvic floor disorders - ordered HbA1C due to increased risk of DM, last 4.7 in 2020 - encouraged follow-up with PCP

## 2023-10-08 NOTE — Assessment & Plan Note (Signed)
-   tenderness with palpation of uterus - Nuswab negative in 2021, repeat vaginal testing to r/o infection - Transvaginal ultrasound ordered - encourage pt to return to GYN to establish care

## 2023-10-08 NOTE — Assessment & Plan Note (Addendum)
-   For treatment of pelvic organ prolapse, we discussed options for management including expectant management, conservative management, and surgical management, such as Kegels, a pessary, pelvic floor physical therapy, and specific surgical procedures. - declines pessary fitting and desires pelvic floor PT with referral placed - discussed importance of stool consistency - encouraged weight reduction

## 2023-10-08 NOTE — Assessment & Plan Note (Signed)
-   bowel movements 1x/week since childhood - bulge symptoms improves after bowel movements - For constipation, we reviewed the importance of a better bowel regimen.  We also discussed the importance of avoiding chronic straining, as it can exacerbate her pelvic floor symptoms; we discussed treating constipation and straining prior to surgery, as postoperative straining can lead to damage to the repair and recurrence of symptoms. We discussed initiating therapy with increasing fluid intake, fiber supplementation, stool softeners, and laxatives such as miralax.  - discussed association with pelvic floor disorders - encouraged squatting position for defecation

## 2023-10-08 NOTE — Patient Instructions (Addendum)
 You have a stage 2 (out of 4) prolapse.  We discussed the fact that it is not life threatening but there are several treatment options. For treatment of pelvic organ prolapse, we discussed options for management including expectant management, conservative management, and surgical management, such as Kegels, a pessary, pelvic floor physical therapy, and specific surgical procedures.     For night time frequency: - avoid fluid intake 3 hours before bedtime  Constipation: Our goal is to achieve formed bowel movements daily or every-other-day.  You may need to try different combinations of the following options to find what works best for you - everybody's body works differently so feel free to adjust the dosages as needed.  Some options to help maintain bowel health include:  Dietary changes (more leafy greens, vegetables and fruits; less processed foods) Fiber supplementation (Benefiber, FiberCon, Metamucil or Psyllium). Start slow and increase gradually to full dose. Over-the-counter agents such as: stool softeners (Docusate or Colace) and/or laxatives (Miralax, milk of magnesia)  "Power Pudding" is a natural mixture that may help your constipation.  To make blend 1 cup applesauce, 1 cup wheat bran, and 3/4 cup prune juice, refrigerate and then take 1 tablespoon daily with a large glass of water as needed.   Women should try to eat at least 21 to 25 grams of fiber a day, while men should aim for 30 to 38 grams a day. You can add fiber to your diet with food or a fiber supplement such as psyllium (metamucil), benefiber, or fibercon.   Here's a look at how much dietary fiber is found in some common foods. When buying packaged foods, check the Nutrition Facts label for fiber content. It can vary among brands.  Fruits Serving size Total fiber (grams)*  Raspberries 1 cup 8.0  Pear 1 medium 5.5  Apple, with skin 1 medium 4.5  Banana 1 medium 3.0  Orange 1 medium 3.0  Strawberries 1 cup 3.0    Vegetables Serving size Total fiber (grams)*  Green peas, boiled 1 cup 9.0  Broccoli, boiled 1 cup chopped 5.0  Turnip greens, boiled 1 cup 5.0  Brussels sprouts, boiled 1 cup 4.0  Potato, with skin, baked 1 medium 4.0  Sweet corn, boiled 1 cup 3.5  Cauliflower, raw 1 cup chopped 2.0  Carrot, raw 1 medium 1.5   Grains Serving size Total fiber (grams)*  Spaghetti, whole-wheat, cooked 1 cup 6.0  Barley, pearled, cooked 1 cup 6.0  Bran flakes 3/4 cup 5.5  Quinoa, cooked 1 cup 5.0  Oat bran muffin 1 medium 5.0  Oatmeal, instant, cooked 1 cup 5.0  Popcorn, air-popped 3 cups 3.5  Brown rice, cooked 1 cup 3.5  Bread, whole-wheat 1 slice 2.0  Bread, rye 1 slice 2.0   Legumes, nuts and seeds Serving size Total fiber (grams)*  Split peas, boiled 1 cup 16.0  Lentils, boiled 1 cup 15.5  Black beans, boiled 1 cup 15.0  Baked beans, canned 1 cup 10.0  Chia seeds 1 ounce 10.0  Almonds 1 ounce (23 nuts) 3.5  Pistachios 1 ounce (49 nuts) 3.0  Sunflower kernels 1 ounce 3.0  *Rounded to nearest 0.5 gram. Source: [employer] for Standard Reference, Legacy Release    Please call radiology at [phone] to schedule your imaging study today

## 2023-10-11 LAB — CERVICOVAGINAL ANCILLARY ONLY
Bacterial Vaginitis (gardnerella): NEGATIVE
Candida Glabrata: NEGATIVE
Candida Vaginitis: NEGATIVE
Chlamydia: NEGATIVE
Comment: NEGATIVE
Comment: NEGATIVE
Comment: NEGATIVE
Comment: NEGATIVE
Comment: NEGATIVE
Comment: NORMAL
Neisseria Gonorrhea: NEGATIVE
Trichomonas: NEGATIVE

## 2023-12-13 ENCOUNTER — Telehealth: Payer: Self-pay | Admitting: Physical Therapy

## 2023-12-13 ENCOUNTER — Ambulatory Visit: Attending: Obstetrics | Admitting: Physical Therapy

## 2023-12-13 NOTE — Telephone Encounter (Signed)
 Called pt due to no show, could not leave a VM

## 2023-12-13 NOTE — Therapy (Incomplete)
 OUTPATIENT PHYSICAL THERAPY FEMALE PELVIC EVALUATION   Patient Name: Ariana [name] MRN: [id number] DOB:09/25/1985, 38 y.o., female Today's Date: 12/13/2023  END OF SESSION:   Past Medical History:  Diagnosis Date   Alpha thalassemia silent carrier    Gestational diabetes    Gestational diabetes    No past surgical history on file. Patient Active Problem List   Diagnosis Date Noted   Constipation 10/08/2023   Dyspareunia, female 10/08/2023   Encounter for Papanicolaou smear of cervix 02/25/2023   Vaginal prolapse without uterine prolapse 02/25/2023   Nexplanon  in place 08/06/2022   Normal labor 07/20/2019   Group B streptococcal bacteriuria 02/01/2019   Alpha thalassemia silent carrier 01/22/2019   Supervision of other normal pregnancy, antepartum 01/06/2019   Nausea and vomiting during pregnancy 01/06/2019   History of gestational diabetes 01/06/2019    PCP: ***  REFERRING PROVIDER: Guadlupe Lianne DASEN, MD  REFERRING DIAG: N81.10 (ICD-10-CM) - Vaginal prolapse without uterine prolapse N94.10 (ICD-10-CM) - Dyspareunia, female K59.00 (ICD-10-CM) - Constipation, unspecified constipation type  THERAPY DIAG:  No diagnosis found.  Rationale for Evaluation and Treatment: Rehabilitation  ONSET DATE: *** 37yo with stage II pelvic organ prolapse, dyspareunia, constipation SUBJECTIVE:                                                                                                                                                                                           SUBJECTIVE STATEMENT: *** Fluid intake:   PAIN:  Are you having pain? {yes/no:20286} NPRS scale: ***/10 Pain location: {pelvic pain location:27098}  Pain type: {type:313116} Pain description: {PAIN DESCRIPTION:21022940}   Aggravating factors: *** Relieving factors: ***  PRECAUTIONS: {Therapy precautions:24002}  RED FLAGS: {PT Red Flags:29287}   WEIGHT BEARING RESTRICTIONS: {Yes ***/No:24003}  FALLS:   Has patient fallen in last 6 months? {fallsyesno:27318}  OCCUPATION: ***  ACTIVITY LEVEL : ***  PLOF: {PLOF:24004}  PATIENT GOALS: ***  PERTINENT HISTORY:  *** Sexual abuse: {Yes/No:304960894}  BOWEL MOVEMENT: Pain with bowel movement: {yes/no:20286} Type of bowel movement:{PT BM type:27100} Fully empty rectum: {No/Yes:304960894} Leakage: {Yes/No:304960894} Pads: {Yes/No:304960894} Fiber supplement/laxative {YES/NO AS:20300}  URINATION: Pain with urination: {yes/no:20286} Fully empty bladder: {Yes/No:[id number]}*** Stream: {PT urination:27102} Urgency: {YES/NO AS:20300} Frequency: *** Leakage: {PT leakage:27103} Pads: {Yes/No:304960894}  INTERCOURSE:  Ability to have vaginal penetration {YES/NO:21197} Pain with intercourse: {pain with intercourse PA:27099} Dryness{YES/NO AS:20300} Climax: *** Marinoff Scale: ***/3 Laxative:  PREGNANCY: Vaginal deliveries *** Tearing {Yes***/No:304960894} Episiotomy {YES/NO AS:20300} C-section deliveries *** Currently pregnant {Yes***/No:304960894}  PROLAPSE: {PT prolapse:27101}   OBJECTIVE:  Note: Objective measures were completed at Evaluation unless otherwise noted.  DIAGNOSTIC FINDINGS:  ***  PATIENT SURVEYS:  {rehab surveys:24030}  PFIQ-7: ***  COGNITION: Overall cognitive status: {cognition:24006}     SENSATION: Light touch: {intact/deficits:24005}  LUMBAR SPECIAL TESTS:  {lumbar special test:25242}  FUNCTIONAL TESTS:  {Functional tests:24029}  GAIT: Assistive device utilized: {Assistive devices:23999} Comments: ***  POSTURE: {posture:25561}   LUMBARAROM/PROM:  A/PROM A/PROM  eval  Flexion   Extension   Right lateral flexion   Left lateral flexion   Right rotation   Left rotation    (Blank rows = not tested)  LOWER EXTREMITY ROM:  {AROM/PROM:27142} ROM Right eval Left eval  Hip flexion    Hip extension    Hip abduction    Hip adduction    Hip internal rotation    Hip external  rotation    Knee flexion    Knee extension    Ankle dorsiflexion    Ankle plantarflexion    Ankle inversion    Ankle eversion     (Blank rows = not tested)  LOWER EXTREMITY MMT:  MMT Right eval Left eval  Hip flexion    Hip extension    Hip abduction    Hip adduction    Hip internal rotation    Hip external rotation    Knee flexion    Knee extension    Ankle dorsiflexion    Ankle plantarflexion    Ankle inversion    Ankle eversion     (Blank rows = not tested) PALPATION:   General: ***  Pelvic Alignment: ***  Abdominal: ***                External Perineal Exam: ***                             Internal Pelvic Floor: ***  Patient confirms identification and approves PT to assess internal pelvic floor and treatment {yes/no:20286}  PELVIC MMT:   MMT eval  Vaginal   Internal Anal Sphincter   External Anal Sphincter   Puborectalis   Diastasis Recti   (Blank rows = not tested)        TONE: ***  PROLAPSE: ***  TODAY'S TREATMENT:                                                                                                                              DATE: ***  EVAL ***   PATIENT EDUCATION:  Education details: *** Person educated: {Person educated:25204} Education method: {Education Method:25205} Education comprehension: {Education Comprehension:25206}  HOME EXERCISE PROGRAM: ***  ASSESSMENT:  CLINICAL IMPRESSION: Patient is a *** y.o. *** who was seen today for physical therapy evaluation and treatment for ***.   OBJECTIVE IMPAIRMENTS: {opptimpairments:25111}.   ACTIVITY LIMITATIONS: {activitylimitations:27494}  PARTICIPATION LIMITATIONS: {participationrestrictions:25113}  PERSONAL FACTORS: {Personal factors:25162} are also affecting patient's functional outcome.   REHAB POTENTIAL: {rehabpotential:25112}  CLINICAL DECISION MAKING: {clinical decision making:25114}  EVALUATION COMPLEXITY: {Evaluation complexity:25115}   GOALS: Goals  reviewed with patient? {yes/no:20286}  SHORT TERM GOALS:  Target date: ***  *** Baseline: Goal status: INITIAL  2.  *** Baseline:  Goal status: INITIAL  3.  *** Baseline:  Goal status: INITIAL  4.  *** Baseline:  Goal status: INITIAL  5.  *** Baseline:  Goal status: INITIAL  6.  *** Baseline:  Goal status: INITIAL  LONG TERM GOALS: Target date: ***  *** Baseline:  Goal status: INITIAL  2.  *** Baseline:  Goal status: INITIAL  3.  *** Baseline:  Goal status: INITIAL  4.  *** Baseline:  Goal status: INITIAL  5.  *** Baseline:  Goal status: INITIAL  6.  *** Baseline:  Goal status: INITIAL  PLAN:  PT FREQUENCY: {rehab frequency:25116}  PT DURATION: {rehab duration:25117}  PLANNED INTERVENTIONS: {rehab planned interventions:25118::97110-Therapeutic exercises,97530- Therapeutic [phone]- Neuromuscular re-education,97535- Self Rjmz,02859- Manual therapy}  PLAN FOR NEXT SESSION: ***   Mariely Mahr, PT 12/13/2023, 7:56 AM

## 2024-01-07 ENCOUNTER — Ambulatory Visit: Admitting: Obstetrics

## 2024-01-07 NOTE — Progress Notes (Deleted)
 [facility] Return Visit  SUBJECTIVE  History of Present Illness: Ariana [name] is a 38 y.o. female seen in follow-up for stage II pelvic organ prolapse, constipation, dyspareunia, and history of GDM. Plan at last visit was obtain HbA1C, pelvic floor PT, weight reduction, TVUS and establish care with GYN, optimization of stool consistency.   Nuswab negative 10/08/23  Past Medical History: Patient  has a past medical history of Alpha thalassemia silent carrier, Gestational diabetes, and Gestational diabetes.   Past Surgical History: She  has no past surgical history on file.   Medications: She currently has no medications in their medication list.   Allergies: Patient is allergic to 5-alpha reductase inhibitors.   Social History: Patient  reports that she has never smoked. She has never used smokeless tobacco. She reports that she does not drink alcohol and does not use drugs.     OBJECTIVE     Physical Exam: There were no vitals filed for this visit. Gen: No apparent distress, A&O x 3.  Detailed Urogynecologic Evaluation:  Deferred. Prior exam showed:      No data to display             ASSESSMENT AND PLAN    Ariana [name] is a 38 y.o. with:  No diagnosis found.  There are no diagnoses linked to this encounter.   Lianne ONEIDA Gillis, MD

## 2024-02-07 ENCOUNTER — Ambulatory Visit (HOSPITAL_COMMUNITY)
Admission: EM | Admit: 2024-02-07 | Discharge: 2024-02-07 | Disposition: A | Discharge status: Home / Self Care | Attending: Family Medicine | Admitting: Family Medicine

## 2024-02-07 ENCOUNTER — Encounter (HOSPITAL_COMMUNITY): Payer: Self-pay | Admitting: *Deleted

## 2024-02-07 ENCOUNTER — Other Ambulatory Visit: Payer: Self-pay

## 2024-02-07 ENCOUNTER — Ambulatory Visit (INDEPENDENT_AMBULATORY_CARE_PROVIDER_SITE_OTHER)

## 2024-02-07 DIAGNOSIS — M546 Pain in thoracic spine: Secondary | ICD-10-CM

## 2024-02-07 DIAGNOSIS — R0781 Pleurodynia: Secondary | ICD-10-CM

## 2024-02-07 DIAGNOSIS — R42 Dizziness and giddiness: Secondary | ICD-10-CM | POA: Diagnosis not present

## 2024-02-07 DIAGNOSIS — M549 Dorsalgia, unspecified: Secondary | ICD-10-CM

## 2024-02-07 LAB — BASIC METABOLIC PANEL WITH GFR
Anion gap: 10 (ref 5–15)
BUN: 11 mg/dL (ref 6–20)
CO2: 26 mmol/L (ref 22–32)
Calcium: 9.3 mg/dL (ref 8.9–10.3)
Chloride: 103 mmol/L (ref 98–111)
Creatinine, Ser: 0.74 mg/dL (ref 0.44–1.00)
GFR, Estimated: >60 mL/min (ref >60)
Glucose, Bld: 74 mg/dL (ref 70–99)
Potassium: 3.7 mmol/L (ref 3.5–5.1)
Sodium: 139 mmol/L (ref 135–145)

## 2024-02-07 LAB — CBC
HCT: 40.5 % (ref 36.0–46.0)
Hemoglobin: 12.5 g/dL (ref 12.0–15.0)
MCH: 29.6 pg (ref 26.0–34.0)
MCHC: 30.9 g/dL (ref 30.0–36.0)
MCV: 96 fL (ref 80.0–100.0)
Platelets: 188 K/uL (ref 150–400)
RBC: 4.22 MIL/uL (ref 3.87–5.11)
RDW: 11.8 % (ref 11.5–15.5)
WBC: 3.1 K/uL — ABNORMAL LOW (ref 4.0–10.5)
nRBC: 0 % (ref 0.0–0.2)

## 2024-02-07 MED ORDER — KETOROLAC TROMETHAMINE 30 MG/ML IJ SOLN
30.0000 mg | Freq: Once | INTRAMUSCULAR | Status: AC
  Administered 2024-02-07: 30 mg via INTRAMUSCULAR

## 2024-02-07 MED ORDER — KETOROLAC TROMETHAMINE 10 MG PO TABS: 10.0000 mg | ORAL_TABLET | Freq: Four times a day (QID) | ORAL | 0 refills | Status: AC | PRN

## 2024-02-07 MED ORDER — KETOROLAC TROMETHAMINE 30 MG/ML IJ SOLN
INTRAMUSCULAR | Status: AC
  Filled 2024-02-07: qty 1

## 2024-02-07 NOTE — ED Provider Notes (Addendum)
 [facility]    CSN: [id number] Arrival date & time: 02/07/24  0847      History   Chief Complaint Chief Complaint  Patient presents with   Chest Pain   Dizziness   Back Pain    HPI Ariana [name] is a 38 y.o. female.    Chest Pain Associated symptoms: back pain and dizziness   Dizziness Associated symptoms: chest pain   Back Pain Associated symptoms: chest pain    Here for chest pain and upper back pain. Symptoms began 8/21. No f/c. She has felt lightheaded at times. No n/v/d. No cough/congestion. No abd pain or dysuria.  The pain is in the upper back , and now it is in her left posterior axillary line. Hurts worse with extension of her neck and spine, or twisting motions. No fall or trauma  Allergic to 5 alpha reductase inhibitors.  LMP began last week.    Past Medical History:  Diagnosis Date   Alpha thalassemia silent carrier    Gestational diabetes    Gestational diabetes     Patient Active Problem List   Diagnosis Date Noted   Constipation 10/08/2023   Dyspareunia, female 10/08/2023   Encounter for Papanicolaou smear of cervix 02/25/2023   Vaginal prolapse without uterine prolapse 02/25/2023   Nexplanon  in place 08/06/2022   Normal labor 07/20/2019   Group B streptococcal bacteriuria 02/01/2019   Alpha thalassemia silent carrier 01/22/2019   Supervision of other normal pregnancy, antepartum 01/06/2019   Nausea and vomiting during pregnancy 01/06/2019   History of gestational diabetes 01/06/2019    History reviewed. No pertinent surgical history.  OB History     Gravida  5   Para  5   Term  5   Preterm      AB      Living  5      SAB      IAB      Ectopic      Multiple  0   Live Births  5            Home Medications    Prior to Admission medications   Medication Sig Start Date End Date Taking? Authorizing Provider  ketorolac  (TORADOL ) 10 MG tablet Take 1 tablet (10 mg total) by mouth every 6 (six) hours as  needed (pain). 02/07/24  Yes Vonna Sharlet POUR, MD    Family History Family History  Problem Relation Age of Onset   Healthy Mother     Social History Social History   Tobacco Use   Smoking status: Never   Smokeless tobacco: Never  Vaping Use   Vaping status: Never Used  Substance Use Topics   Alcohol use: No   Drug use: No     Allergies   5-alpha reductase inhibitors   Review of Systems Review of Systems  Cardiovascular:  Positive for chest pain.  Musculoskeletal:  Positive for back pain.  Neurological:  Positive for dizziness.     Physical Exam Triage Vital Signs ED Triage Vitals  Encounter Vitals Group     BP 02/07/24 0938 (!) 126/90     Girls Systolic BP Percentile --      Girls Diastolic BP Percentile --      Boys Systolic BP Percentile --      Boys Diastolic BP Percentile --      Pulse Rate 02/07/24 0938 (!) 57     Resp 02/07/24 0938 20     Temp 02/07/24 0938 98.2 F (  36.8 C)     Temp Source 02/07/24 0938 Oral     SpO2 02/07/24 0938 100 %     Weight --      Height --      Head Circumference --      Peak Flow --      Pain Score 02/07/24 0945 7     Pain Loc --      Pain Education --      Exclude from Growth Chart --    No data found.  Updated Vital Signs BP (!) 126/90   Pulse (!) 57   Temp 98.2 F (36.8 C) (Oral)   Resp 20   SpO2 100%   Visual Acuity Right Eye Distance:   Left Eye Distance:   Bilateral Distance:    Right Eye Near:   Left Eye Near:    Bilateral Near:     Physical Exam Vitals reviewed.  Constitutional:      General: She is not in acute distress.    Appearance: She is not ill-appearing, toxic-appearing or diaphoretic.  HENT:     Nose: Nose normal.     Mouth/Throat:     Mouth: Mucous membranes are moist.  Eyes:     Extraocular Movements: Extraocular movements intact.     Conjunctiva/sclera: Conjunctivae normal.     Pupils: Pupils are equal, round, and reactive to light.  Cardiovascular:     Rate and Rhythm:  Normal rate and regular rhythm.     Heart sounds: No murmur heard. Pulmonary:     Effort: Pulmonary effort is normal. No respiratory distress.     Breath sounds: Normal breath sounds. No stridor. No wheezing, rhonchi or rales.     Comments: Chest is tender in left posterior axillary line and in upper thoracic area. No rash or deformity Musculoskeletal:     Cervical back: Neck supple.  Lymphadenopathy:     Cervical: No cervical adenopathy.  Skin:    Coloration: Skin is not pale.  Neurological:     General: No focal deficit present.     Mental Status: She is alert and oriented to person, place, and time.  Psychiatric:        Behavior: Behavior normal.      UC Treatments / Results  Labs (all labs ordered are listed, but only abnormal results are displayed) Labs Reviewed  CBC  BASIC METABOLIC PANEL WITH GFR    EKG   Radiology No results found.  Procedures Procedures (including critical care time)  Medications Ordered in UC Medications  ketorolac  (TORADOL ) 30 MG/ML injection 30 mg (has no administration in time range)    Initial Impression / Assessment and Plan / UC Course  I have reviewed the triage vital signs and the nursing notes.  Pertinent labs & imaging results that were available during my care of the patient were reviewed by me and considered in my medical decision making (see chart for details).    EKG is normal.  Xray by my review does not show any bony abnormality or any cardiothoracic pathology. She is advised of radiology overread.  Toradol  given here, and toradol  tablets are sent to the pharmacy.  Blood drawn to assess her symptms, CBC and BMP. Staff will notify her of any significant abnormalities. Final Clinical Impressions(s) / UC Diagnoses   Final diagnoses:  Upper back pain  Rib pain on left side  Dizziness     Discharge Instructions      Your chest xray and rib xrays do  not show any abnormality by my review. The radiologist will also  read your x-ray, and if their interpretation differs significantly from mine, and the management of your condition would change, we will call you.  You have been given a shot of Toradol  30 mg today.  Ketorolac  10 mg tablets--take 1 tablet every 6 hours as needed for pain.  This is the same medicine that is in the shot we just gave you. Do not take ibuprofen  or aspirin with this medicine.  We have drawn blood to check your blood counts and electrolytes and sugar.  Staff will notify you if there is anything significantly abnormal  Make sure you are getting in plenty of oral fluids.      ED Prescriptions     Medication Sig Dispense Auth. Provider   ketorolac  (TORADOL ) 10 MG tablet Take 1 tablet (10 mg total) by mouth every 6 (six) hours as needed (pain). 20 tablet Mykel Mohl K, MD      PDMP not reviewed this encounter.   Vonna Sharlet POUR, MD 02/07/24 1057    Vonna Sharlet POUR, MD 02/07/24 [phone]

## 2024-02-07 NOTE — Discharge Instructions (Addendum)
 Your chest xray and rib xrays do not show any abnormality by my review. The radiologist will also read your x-ray, and if their interpretation differs significantly from mine, and the management of your condition would change, we will call you.  You have been given a shot of Toradol  30 mg today.  Ketorolac  10 mg tablets--take 1 tablet every 6 hours as needed for pain.  This is the same medicine that is in the shot we just gave you. Do not take ibuprofen  or aspirin with this medicine.  We have drawn blood to check your blood counts and electrolytes and sugar.  Staff will notify you if there is anything significantly abnormal  Make sure you are getting in plenty of oral fluids.

## 2024-02-07 NOTE — ED Triage Notes (Signed)
 C/O mid-back pain intermittently going through to chest over past week, along with intermittent dizziness. Also c/o left flank pain at this time. Pain worse and very tender in back with palpation.

## 2024-02-08 ENCOUNTER — Ambulatory Visit (HOSPITAL_COMMUNITY): Payer: Self-pay
